# Patient Record
Sex: Male | Born: 1950
Health system: Southern US, Community
[De-identification: ages and names within clinical notes are randomized; demographics above are authoritative.]

## PROBLEM LIST (undated history)

## (undated) DIAGNOSIS — L719 Rosacea, unspecified: Secondary | ICD-10-CM

## (undated) DIAGNOSIS — T7840XA Allergy, unspecified, initial encounter: Secondary | ICD-10-CM

## (undated) DIAGNOSIS — K5790 Diverticulosis of intestine, part unspecified, without perforation or abscess without bleeding: Secondary | ICD-10-CM

## (undated) DIAGNOSIS — I10 Essential (primary) hypertension: Secondary | ICD-10-CM

## (undated) DIAGNOSIS — E785 Hyperlipidemia, unspecified: Secondary | ICD-10-CM

## (undated) DIAGNOSIS — Z8719 Personal history of other diseases of the digestive system: Secondary | ICD-10-CM

## (undated) HISTORY — DX: Allergy, unspecified, initial encounter: T78.40XA

## (undated) HISTORY — DX: Hyperlipidemia, unspecified: E78.5

## (undated) HISTORY — PX: TONSILLECTOMY: SUR1361

## (undated) HISTORY — DX: Diverticulosis of intestine, part unspecified, without perforation or abscess without bleeding: K57.90

## (undated) HISTORY — DX: Essential (primary) hypertension: I10

## (undated) HISTORY — DX: Rosacea, unspecified: L71.9

---

## 2007-12-19 ENCOUNTER — Encounter: Admission: RE | Admit: 2007-12-19 | Discharge: 2007-12-19 | Payer: Self-pay | Admitting: Internal Medicine

## 2008-08-20 HISTORY — PX: COLONOSCOPY: SHX174

## 2008-10-31 ENCOUNTER — Ambulatory Visit (HOSPITAL_COMMUNITY): Admission: RE | Admit: 2008-10-31 | Discharge: 2008-10-31 | Payer: Self-pay | Admitting: Family Medicine

## 2010-04-14 ENCOUNTER — Ambulatory Visit: Payer: Self-pay | Admitting: Internal Medicine

## 2010-04-14 LAB — CONVERTED CEMR LAB
AST: 27 units/L (ref 0–37)
Alkaline Phosphatase: 89 units/L (ref 39–117)
BUN: 11 mg/dL (ref 6–23)
Basophils Relative: 0.7 % (ref 0.0–3.0)
Bilirubin Urine: NEGATIVE
Bilirubin, Direct: 0.2 mg/dL (ref 0.0–0.3)
CO2: 29 meq/L (ref 19–32)
Chloride: 104 meq/L (ref 96–112)
Direct LDL: 137.1 mg/dL
Eosinophils Absolute: 0.2 10*3/uL (ref 0.0–0.7)
Eosinophils Relative: 2.4 % (ref 0.0–5.0)
Glucose, Bld: 77 mg/dL (ref 70–99)
HDL: 59.9 mg/dL (ref >39.00)
Hemoglobin: 17.7 g/dL — ABNORMAL HIGH (ref 13.0–17.0)
Lymphocytes Relative: 22.5 % (ref 12.0–46.0)
MCHC: 34.1 g/dL (ref 30.0–36.0)
Monocytes Relative: 6.5 % (ref 3.0–12.0)
Neutrophils Relative %: 67.9 % (ref 43.0–77.0)
Nitrite: NEGATIVE
Potassium: 5.4 meq/L — ABNORMAL HIGH (ref 3.5–5.1)
RBC: 5.34 M/uL (ref 4.22–5.81)
Sodium: 141 meq/L (ref 135–145)
Total CHOL/HDL Ratio: 4
Total Protein, Urine: NEGATIVE mg/dL
Total Protein: 7.3 g/dL (ref 6.0–8.3)
Urine Glucose: NEGATIVE mg/dL
VLDL: 22.6 mg/dL (ref 0.0–40.0)
WBC: 7.8 10*3/uL (ref 4.5–10.5)
pH: 6 (ref 5.0–8.0)

## 2010-04-18 ENCOUNTER — Encounter: Payer: Self-pay | Admitting: Internal Medicine

## 2010-04-18 ENCOUNTER — Ambulatory Visit: Payer: Self-pay | Admitting: Internal Medicine

## 2010-04-18 DIAGNOSIS — K5730 Diverticulosis of large intestine without perforation or abscess without bleeding: Secondary | ICD-10-CM | POA: Insufficient documentation

## 2010-04-18 DIAGNOSIS — R0609 Other forms of dyspnea: Secondary | ICD-10-CM

## 2010-04-18 DIAGNOSIS — R0989 Other specified symptoms and signs involving the circulatory and respiratory systems: Secondary | ICD-10-CM | POA: Insufficient documentation

## 2010-04-18 DIAGNOSIS — I10 Essential (primary) hypertension: Secondary | ICD-10-CM

## 2010-04-18 HISTORY — PX: COLONOSCOPY: SHX174

## 2010-07-03 ENCOUNTER — Telehealth: Payer: Self-pay | Admitting: Internal Medicine

## 2010-07-14 ENCOUNTER — Ambulatory Visit: Payer: Self-pay | Admitting: Internal Medicine

## 2010-07-14 LAB — CONVERTED CEMR LAB
BUN: 13 mg/dL (ref 6–23)
Basophils Relative: 0.5 % (ref 0.0–3.0)
Chloride: 106 meq/L (ref 96–112)
Eosinophils Relative: 4.9 % (ref 0.0–5.0)
HCT: 48.4 % (ref 39.0–52.0)
Lymphs Abs: 2.1 10*3/uL (ref 0.7–4.0)
MCV: 96.8 fL (ref 78.0–100.0)
Monocytes Absolute: 0.4 10*3/uL (ref 0.1–1.0)
Neutro Abs: 6.4 10*3/uL (ref 1.4–7.7)
Potassium: 4.6 meq/L (ref 3.5–5.1)
RBC: 5 M/uL (ref 4.22–5.81)
WBC: 9.4 10*3/uL (ref 4.5–10.5)

## 2010-07-25 ENCOUNTER — Ambulatory Visit: Payer: Self-pay | Admitting: Internal Medicine

## 2010-07-25 DIAGNOSIS — R21 Rash and other nonspecific skin eruption: Secondary | ICD-10-CM

## 2010-07-25 DIAGNOSIS — H9319 Tinnitus, unspecified ear: Secondary | ICD-10-CM | POA: Insufficient documentation

## 2010-09-19 NOTE — Assessment & Plan Note (Signed)
Summary: NEW CPX/UNITED HC/CD--OK'D DR PLOT   Vital Signs:  Patient profile:   60 year old male Height:      67 inches Weight:      215 pounds BMI:     33.80 O2 Sat:      95 % on Room air Temp:     97.2 degrees F oral Pulse rate:   87 / minute Pulse rhythm:   regular Resp:     16 per minute BP sitting:   128 / 80  (left arm) Cuff size:   regular  Vitals Entered By: Lanier Prude, CMA(AAMA) (April 18, 2010 3:02 PM)  O2 Flow:  Room air Is Patient Diabetic? No   History of Present Illness: The patient presents for a preventive health examination - new  Preventive Screening-Counseling & Management  Alcohol-Tobacco     Smoking Status: quit  Caffeine-Diet-Exercise     Does Patient Exercise: no  Current Medications (verified): 1)  Diovan 320 Mg Tabs (Valsartan) .Marland Kitchen.. 1 Once Daily 2)  Valacyclovir Hcl 1 Gm Tabs (Valacyclovir Hcl) .... As Needed 3)  Amlodipine Besylate 10 Mg Tabs (Amlodipine Besylate) .Marland Kitchen.. 1 Once Daily 4)  Claritin 10 Mg Tabs (Loratadine) .Marland Kitchen.. 1 Once Daily 5)  Zolpidem Tartrate 10 Mg Tabs (Zolpidem Tartrate) .Marland Kitchen.. 1 At Bedtime 6)  Aspirin 81 Mg Chew (Aspirin) .Marland Kitchen.. 1 Once Daily  Allergies (verified): No Known Drug Allergies  Past History:  Past Medical History: Hypertension Rosacea Gen herpes on the leg Diverticulosis/itis, colonoscopy 2009 Dr Tedra [place] Tinnitus  Past Surgical History: Tonsillectomy  Family History: F died of diverticulitis DM M DM Family History Diabetes 1st degree relative  Social History: Occupation: Herbalist Married Former Smoker Alcohol use-yes Regular exercise-no Smoking Status:  quit Does Patient Exercise:  no  Review of Systems       The patient complains of weight gain.  The patient denies anorexia, fever, weight loss, vision loss, decreased hearing, hoarseness, chest pain, syncope, dyspnea on exertion, peripheral edema, prolonged cough, headaches, hemoptysis, abdominal pain, melena, hematochezia,  severe indigestion/heartburn, hematuria, incontinence, genital sores, muscle weakness, suspicious skin lesions, transient blindness, difficulty walking, depression, unusual weight change, abnormal bleeding, enlarged lymph nodes, angioedema, and testicular masses.         snoring  Physical Exam  General:   well-developed and overweight-appearing.   Head:  Normocephalic and atraumatic without obvious abnormalities. No apparent alopecia or balding. Eyes:  No corneal or conjunctival inflammation noted. EOMI. Perrla. Funduscopic exam benign, without hemorrhages, exudates or papilledema. Vision grossly normal. Ears:  External ear exam shows no significant lesions or deformities.  Otoscopic examination reveals clear canals, tympanic membranes are intact bilaterally without bulging, retraction, inflammation or discharge. Hearing is grossly normal bilaterally. Nose:  External nasal examination shows no deformity or inflammation. Nasal mucosa are pink and moist without lesions or exudates. Mouth:  Oral mucosa and oropharynx without lesions or exudates.  Teeth in good repair. Neck:  No deformities, masses, or tenderness noted. Lungs:  Normal respiratory effort, chest expands symmetrically. Lungs are clear to auscultation, no crackles or wheezes. Heart:  Normal rate and regular rhythm. S1 and S2 normal without gallop, murmur, click, rub or other extra sounds. Abdomen:  Bowel sounds positive,abdomen soft and non-tender without masses, organomegaly or hernias noted. Rectal:  No external abnormalities noted. Normal sphincter tone. No rectal masses or tenderness. Genitalia:  Testes bilaterally descended without nodularity, tenderness or masses. No scrotal masses or lesions. No penis lesions or urethral discharge. Prostate:  Prostate  gland firm and smooth, no enlargement, nodularity, tenderness, mass, asymmetry or induration. Msk:  No deformity or scoliosis noted of thoracic or lumbar spine.   Pulses:  R and L  carotid,radial,femoral,dorsalis pedis and posterior tibial pulses are full and equal bilaterally Extremities:  No clubbing, cyanosis, edema, or deformity noted with normal full range of motion of all joints.   Neurologic:  No cranial nerve deficits noted. Station and gait are normal. Plantar reflexes are down-going bilaterally. DTRs are symmetrical throughout. Sensory, motor and coordinative functions appear intact. Skin:  Intact without suspicious lesions or rashes Cervical Nodes:  No lymphadenopathy noted Inguinal Nodes:  No significant adenopathy Psych:  Cognition and judgment appear intact. Alert and cooperative with normal attention span and concentration. No apparent delusions, illusions, hallucinations   Impression & Recommendations:  Problem # 1:  HEALTH MAINTENANCE EXAM (ICD-V70.0) Assessment New  Health and age related issues were discussed. Available screening tests and vaccinations were discussed as well. Healthy life style including good diet and exercise was discussed. The labs were reviewed with the patient.   Orders: EKG w/ Interpretation (93000) OK  Problem # 2:  SNORING (ICD-786.09) Assessment: Comment Only Denies OSA  Problem # 3:  DIVERTICULOSIS, COLON (ICD-562.10) Assessment: Unchanged  Problem # 4:  HYPERTENSION (ICD-401.9) Assessment: Unchanged  His updated medication list for this problem includes:    Diovan 320 Mg Tabs (Valsartan) .Marland Kitchen... 1 once daily    Amlodipine Besylate 10 Mg Tabs (Amlodipine besylate) .Marland Kitchen... 1 once daily  BP today: 128/80  Labs Reviewed: K+: 5.4 (04/14/2010) Creat: : 1.1 (04/14/2010)   Chol: 222 (04/14/2010)   HDL: 59.90 (04/14/2010)   TG: 113.0 (04/14/2010)  Complete Medication List: 1)  Diovan 320 Mg Tabs (Valsartan) .Marland Kitchen.. 1 once daily 2)  Valacyclovir Hcl 1 Gm Tabs (Valacyclovir hcl) .... As needed 3)  Amlodipine Besylate 10 Mg Tabs (Amlodipine besylate) .Marland Kitchen.. 1 once daily 4)  Claritin 10 Mg Tabs (Loratadine) .Marland Kitchen.. 1 once  daily 5)  Zolpidem Tartrate 10 Mg Tabs (Zolpidem tartrate) .Marland Kitchen.. 1 at bedtime 6)  Aspirin 81 Mg Chew (Aspirin) .Marland Kitchen.. 1 once daily 7)  Vitamin D 1000 Unit Tabs (Cholecalciferol) .Marland Kitchen.. 1 by mouth qd 8)  Flonase 50 Mcg/act Susp (Fluticasone propionate) .Marland Kitchen.. 1 spr each nostr qd as needed  Other Orders: Future Orders: Admin 1st Vaccine (16109) ... 04/19/2010 Flu Vaccine 46yrs + (60454) ... 04/19/2010  Patient Instructions: 1)  Please schedule a follow-up appointment in 3 months. 2)  BMP prior to visit, ICD-9: 3)  CBC w/ Diff prior to visit, ICD-9: 995.20 4)  Contour pillow 5)  Try to eat more raw plant food, fresh and dry fruit, raw almonds, leafy vegetables, whole foods and less red meat, less animal fat. Poultry and fish is better for you than pork and beef. Avoid processed foods (canned soups, hot dogs, sausage, bacon , frozen dinners). Avoid corn syrup, high fructose syrup or aspartam  containing drinks. Honey, Agave and Stevia are better sweeteners. 6)  Use the Sinus rinse as needed  Prescriptions: AMLODIPINE BESYLATE 10 MG TABS (AMLODIPINE BESYLATE) 1 once daily  #30 x 11   Entered and Authorized by:   Tresa Garter MD   Signed by:   Tresa Garter MD on 04/18/2010   Method used:   Print then Give to Patient   RxID:   [id number] VALACYCLOVIR HCL 1 GM TABS (VALACYCLOVIR HCL) as needed  #30 x 11   Entered and Authorized by:   Tresa Garter MD  Signed by:   Tresa Garter MD on 04/18/2010   Method used:   Print then Give to Patient   RxID:   [id number] DIOVAN 320 MG TABS (VALSARTAN) 1 once daily  #30 x 11   Entered and Authorized by:   Tresa Garter MD   Signed by:   Tresa Garter MD on 04/18/2010   Method used:   Print then Give to Patient   RxID:   [id number] ZOLPIDEM TARTRATE 10 MG TABS (ZOLPIDEM TARTRATE) 1 at bedtime  #30 x 6   Entered and Authorized by:   Tresa Garter MD   Signed by:   Tresa Garter MD on  04/18/2010   Method used:   Print then Give to Patient   RxID:   [id number] FLONASE 50 MCG/ACT SUSP (FLUTICASONE PROPIONATE) 1 spr each nostr qd as needed  #1 x 6   Entered and Authorized by:   Tresa Garter MD   Signed by:   Tresa Garter MD on 04/18/2010   Method used:   Print then Give to Patient   RxID:   [id number]  .lbflu   Flu Vaccine Consent Questions     Do you have a history of severe allergic reactions to this vaccine? no    Any prior history of allergic reactions to egg and/or gelatin? no    Do you have a sensitivity to the preservative Thimersol? no    Do you have a past history of Guillan-Barre Syndrome? no    Do you currently have an acute febrile illness? no    Have you ever had a severe reaction to latex? no    Vaccine information given and explained to patient? yes    Are you currently pregnant? no    Lot Number:[id number]   Exp Date:02/17/2011   Site Given  Left Deltoid IM Lanier Prude, [facility])  April 19, 2010 10:55 AM

## 2010-09-19 NOTE — Assessment & Plan Note (Signed)
Summary: 3 MO ROV /NWS  #   Vital Signs:  Patient profile:   60 year old male Height:      67 inches Weight:      215 pounds BMI:     33.80 O2 Sat:      96 % on Room air Temp:     98.1 degrees F oral Pulse rate:   83 / minute Pulse rhythm:   regular BP sitting:   128 / 80  (left arm) Cuff size:   large  Vitals Entered By: Rock Nephew CMA (July 25, 2010 7:56 AM)  O2 Flow:  Room air CC: follow-up visit Is Patient Diabetic? No Pain Assessment Patient in pain? no       Does patient need assistance? Functional Status Self care Ambulation Normal   CC:  follow-up visit.  History of Present Illness: F/u HTN, tinnitus, insomnia  Current Medications (verified): 1)  Diovan 320 Mg Tabs (Valsartan) .Marland Kitchen.. 1 Once Daily 2)  Valacyclovir Hcl 1 Gm Tabs (Valacyclovir Hcl) .... As Needed 3)  Amlodipine Besylate 10 Mg Tabs (Amlodipine Besylate) .Marland Kitchen.. 1 Once Daily 4)  Claritin 10 Mg Tabs (Loratadine) .Marland Kitchen.. 1 Once Daily 5)  Zolpidem Tartrate 10 Mg Tabs (Zolpidem Tartrate) .Marland Kitchen.. 1 At Bedtime 6)  Aspirin 81 Mg Chew (Aspirin) .Marland Kitchen.. 1 Once Daily 7)  Vitamin D 1000 Unit Tabs (Cholecalciferol) .Marland Kitchen.. 1 By Mouth Qd 8)  Flonase 50 Mcg/act Susp (Fluticasone Propionate) .Marland Kitchen.. 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   Eyes:  No corneal or conjunctival inflammation noted. EOMI. Perrla. Funduscopic exam benign, without hemorrhages, exudates or papilledema. Vision grossly normal. Ears:  slight skin irritation in the canal opening B Mouth:  Oral mucosa and oropharynx without lesions or  exudates.  Teeth in good repair. Neck:  No deformities, masses, or tenderness noted. Lungs:  Normal respiratory effort, chest expands symmetrically. Lungs are clear to auscultation, no crackles or wheezes. Heart:  Normal rate and regular rhythm. S1 and S2 normal without gallop, murmur, click, rub or other extra sounds. Abdomen:  Bowel sounds positive,abdomen soft and non-tender without masses, organomegaly or hernias noted. Msk:  No deformity or scoliosis noted of thoracic or lumbar spine.   Neurologic:  No cranial nerve deficits noted. Station and gait are normal. Plantar reflexes are down-going bilaterally. DTRs are symmetrical throughout. Sensory, motor and coordinative functions appear intact. Skin:  Intact without suspicious lesions or rashes Psych:  Cognition and judgment appear intact. Alert and cooperative with normal attention span and concentration. No apparent delusions, illusions, hallucinations   Impression & Recommendations:  Problem # 1:  TINNITUS (ICD-388.30) Assessment Unchanged Try gabapentin  Problem # 2:  HYPERTENSION (ICD-401.9) Assessment: Improved  His updated medication list for this problem includes:    Diovan 320 Mg Tabs (Valsartan) .Marland Kitchen... 1 once daily    Amlodipine Besylate 10 Mg Tabs (Amlodipine besylate) .Marland Kitchen... 1 once daily  BP today: 128/80 Prior BP: 128/80 (04/18/2010)  Labs Reviewed: K+: 4.6 (07/14/2010) Creat: : 1.2 (07/14/2010)   Chol: 222 (04/14/2010)   HDL: 59.90 (04/14/2010)   TG: 113.0 (04/14/2010)  Problem # 3:  SKIN RASH (ICD-782.1) ears Assessment: New  His  updated medication list for this problem includes:    Lotrisone 1-0.05 % Crea (Clotrimazole-betamethasone) ..... Use bid  Problem # 4:  SNORING (ICD-786.09) Assessment: Unchanged  Complete Medication List: 1)  Diovan 320 Mg Tabs (Valsartan) .Marland Kitchen.. 1 once daily 2)  Valacyclovir Hcl 1 Gm Tabs (Valacyclovir hcl) .... As needed 3)  Amlodipine Besylate 10 Mg Tabs (Amlodipine besylate) .Marland Kitchen..  1 once daily 4)  Claritin 10 Mg Tabs (Loratadine) .Marland Kitchen.. 1 once daily 5)  Zolpidem Tartrate 10 Mg Tabs (Zolpidem tartrate) .Marland Kitchen.. 1 at bedtime 6)  Aspirin 81 Mg Chew (Aspirin) .Marland Kitchen.. 1 once daily 7)  Vitamin D 1000 Unit Tabs (Cholecalciferol) .Marland Kitchen.. 1 by mouth qd 8)  Flonase 50 Mcg/act Susp (Fluticasone propionate) .Marland Kitchen.. 1 spr each nostr qd as needed 9)  Gabapentin 100 Mg Caps (Gabapentin) .Marland Kitchen.. 1-2 by mouth at bedtime or bid as needed tinnitus 10)  Lotrisone 1-0.05 % Crea (Clotrimazole-betamethasone) .... Use bid  Patient Instructions: 1)  Use the Sinus rinse as needed  2)  Please schedule a follow-up appointment in 1 year well w/labs. Prescriptions: LOTRISONE 1-0.05 % CREA (CLOTRIMAZOLE-BETAMETHASONE) use bid  #45g x 1   Entered and Authorized by:   Tresa Garter MD   Signed by:   Tresa Garter MD on 07/25/2010   Method used:   Print then Give to Patient   RxID:   [id number] GABAPENTIN 100 MG CAPS (GABAPENTIN) 1-2 by mouth at bedtime or bid as needed tinnitus  #120 x 6   Entered and Authorized by:   Tresa Garter MD   Signed by:   Tresa Garter MD on 07/25/2010   Method used:   Print then Give to Patient   RxID:   [id number]    Orders Added: 1)  Est. Patient Level IV [84696]

## 2010-09-19 NOTE — Progress Notes (Signed)
Summary: DOT PHYSICAL  Phone Note Call from Patient Call back at [phone]   Summary of Call: Pt needs DOT physical. Can pt drop off DOT form for completion? Or does he need another office visit?  Initial call taken by: Lamar Sprinkles, CMA,  July 03, 2010 10:35 AM  Follow-up for Phone Call        needs OV - bring paperwork w/you Follow-up by: Tresa Garter MD,  July 03, 2010 9:03 PM  Additional Follow-up for Phone Call Additional follow up Details #1::        pt informed  Additional Follow-up by: Lanier Prude, [facility]),  July 04, 2010 8:29 AM

## 2010-10-02 ENCOUNTER — Telehealth: Payer: Self-pay | Admitting: Internal Medicine

## 2010-10-11 NOTE — Progress Notes (Signed)
Summary: RF AMBIEN   Phone Note Refill Request Message from:  Pharmacy  Refills Requested: Medication #1:  ZOLPIDEM TARTRATE 10 MG TABS 1 at bedtime [facility]  Initial call taken by: Lamar Sprinkles, CMA,  October 02, 2010 6:40 PM  Follow-up for Phone Call        ok to ref x 3 Follow-up by: Tresa Garter MD,  October 02, 2010 10:14 PM    Prescriptions: ZOLPIDEM TARTRATE 10 MG TABS (ZOLPIDEM TARTRATE) 1 at bedtime  #30 x 3   Entered by:   Rock Nephew CMA   Authorized by:   Tresa Garter MD   Signed by:   Rock Nephew CMA on 10/03/2010   Method used:   Telephoned to ...       [facility]* (retail)       [address]       [place], [place]  [id number]       Ph: [phone]       Fax: [phone]   RxID:   [id number]

## 2010-11-30 LAB — CREATININE, SERUM: GFR calc non Af Amer: >60 mL/min (ref >60)

## 2011-01-25 ENCOUNTER — Other Ambulatory Visit: Payer: Self-pay | Admitting: Internal Medicine

## 2011-01-25 NOTE — Telephone Encounter (Signed)
Ok to Rf? 

## 2011-03-09 ENCOUNTER — Encounter: Payer: Self-pay | Admitting: Internal Medicine

## 2011-03-09 ENCOUNTER — Ambulatory Visit (INDEPENDENT_AMBULATORY_CARE_PROVIDER_SITE_OTHER): Payer: 59 | Admitting: Internal Medicine

## 2011-03-09 DIAGNOSIS — Z136 Encounter for screening for cardiovascular disorders: Secondary | ICD-10-CM

## 2011-03-09 DIAGNOSIS — R079 Chest pain, unspecified: Secondary | ICD-10-CM | POA: Insufficient documentation

## 2011-03-09 DIAGNOSIS — S2239XA Fracture of one rib, unspecified side, initial encounter for closed fracture: Secondary | ICD-10-CM

## 2011-03-09 DIAGNOSIS — S2232XA Fracture of one rib, left side, initial encounter for closed fracture: Secondary | ICD-10-CM | POA: Insufficient documentation

## 2011-03-09 MED ORDER — TRAMADOL HCL 50 MG PO TABS: 50.0000 mg | ORAL_TABLET | Freq: Two times a day (BID) | ORAL | Status: AC | PRN

## 2011-03-09 MED ORDER — MELOXICAM 15 MG PO TABS: 15.0000 mg | ORAL_TABLET | Freq: Every day | ORAL | Status: AC | PRN

## 2011-03-09 NOTE — Progress Notes (Signed)
  Subjective:    Patient ID: Raymond Snow, male    DOB: Oct 16, 1950, 60 y.o.   MRN: [id number]  HPI C/o sharp pain in L chest after he worked on his boat last Sat and was laying on the ledge cutting into his L ribs. He moved and had a sudden onset severe pain. Pain is worse w/ROM 8/10 C/o pain irrad in L back as well at times   Review of Systems  Constitutional: Negative for appetite change, fatigue and unexpected weight change.  HENT: Negative for nosebleeds, congestion, sore throat, sneezing, trouble swallowing and neck pain.   Eyes: Negative for itching and visual disturbance.  Respiratory: Negative for cough.   Cardiovascular: Positive for chest pain. Negative for palpitations and leg swelling.  Gastrointestinal: Negative for nausea, diarrhea, blood in stool and abdominal distention.  Genitourinary: Negative for frequency and hematuria.  Musculoskeletal: Negative for back pain, joint swelling and gait problem.  Skin: Negative for rash.  Neurological: Negative for dizziness, tremors, speech difficulty and weakness.  Psychiatric/Behavioral: Negative for sleep disturbance, dysphoric mood and agitation. The patient is not nervous/anxious.        Objective:   Physical Exam  Constitutional: He is oriented to person, place, and time. He appears well-developed.  HENT:  Mouth/Throat: Oropharynx is clear and moist.  Eyes: Conjunctivae are normal. Pupils are equal, round, and reactive to light.  Neck: Normal range of motion. No JVD present. No thyromegaly present.  Cardiovascular: Normal rate, regular rhythm, normal heart sounds and intact distal pulses.  Exam reveals no gallop and no friction rub.   No murmur heard. Pulmonary/Chest: Effort normal and breath sounds normal. No respiratory distress. He has no wheezes. He has no rales. He exhibits tenderness (tender under L breast).  Abdominal: Soft. Bowel sounds are normal. He exhibits no distension and no mass. There is no tenderness. There  is no rebound and no guarding.  Musculoskeletal: Normal range of motion. He exhibits no edema and no tenderness.  Lymphadenopathy:    He has no cervical adenopathy.  Neurological: He is alert and oriented to person, place, and time. He has normal reflexes. No cranial nerve deficit. He exhibits normal muscle tone. Coordination normal.  Skin: Skin is warm and dry. No rash noted.  Psychiatric: He has a normal mood and affect. His behavior is normal. Judgment and thought content normal.        Procedure Note :    Procedure :   Sonography examination chest wall   Indication: r/o rib fx   Equipment used: Sonosite M-Turbo with HFL38x/13-6 MHz transducer linear probe. The images were stored in the unit and later transferred in storage.  The patient was placed in a  R decubitus position.  This study revealed a L 6th rib cortex disruption in a  Bony part of the rib at mid-clav line and a small hematoma presence; the area is tender with probe pressure. Pleural sliding is normal.   Impression: L 6th rib fracture.     Assessment & Plan:

## 2011-03-09 NOTE — Assessment & Plan Note (Addendum)
Rib belt See Meds Ice

## 2011-03-09 NOTE — Patient Instructions (Signed)
Ice to chest

## 2011-03-09 NOTE — Assessment & Plan Note (Signed)
EKG was OK

## 2011-04-12 ENCOUNTER — Encounter: Payer: Self-pay | Admitting: Internal Medicine

## 2011-04-12 DIAGNOSIS — Z0000 Encounter for general adult medical examination without abnormal findings: Secondary | ICD-10-CM

## 2011-04-12 DIAGNOSIS — Z0389 Encounter for observation for other suspected diseases and conditions ruled out: Secondary | ICD-10-CM

## 2011-04-13 ENCOUNTER — Other Ambulatory Visit: Payer: Self-pay

## 2011-04-17 ENCOUNTER — Other Ambulatory Visit (INDEPENDENT_AMBULATORY_CARE_PROVIDER_SITE_OTHER): Payer: 59

## 2011-04-17 ENCOUNTER — Other Ambulatory Visit: Payer: Self-pay | Admitting: Internal Medicine

## 2011-04-17 DIAGNOSIS — Z0000 Encounter for general adult medical examination without abnormal findings: Secondary | ICD-10-CM

## 2011-04-17 DIAGNOSIS — Z0389 Encounter for observation for other suspected diseases and conditions ruled out: Secondary | ICD-10-CM

## 2011-04-17 LAB — TSH: TSH: 0.9 u[IU]/mL (ref 0.35–5.50)

## 2011-04-17 LAB — URINALYSIS, ROUTINE W REFLEX MICROSCOPIC
Bilirubin Urine: NEGATIVE
Hgb urine dipstick: NEGATIVE
Ketones, ur: NEGATIVE
Leukocytes, UA: NEGATIVE
Nitrite: NEGATIVE
Specific Gravity, Urine: >=1.03 (ref 1.000–1.030)
Total Protein, Urine: NEGATIVE
Urine Glucose: NEGATIVE
Urobilinogen, UA: 0.2 (ref 0.0–1.0)
pH: 5.5 (ref 5.0–8.0)

## 2011-04-17 LAB — CBC WITH DIFFERENTIAL/PLATELET
Basophils Absolute: 0.1 10*3/uL (ref 0.0–0.1)
Basophils Relative: 0.9 % (ref 0.0–3.0)
Eosinophils Absolute: 0.1 10*3/uL (ref 0.0–0.7)
Eosinophils Relative: 2 % (ref 0.0–5.0)
HCT: 48.2 % (ref 39.0–52.0)
Hemoglobin: 16 g/dL (ref 13.0–17.0)
Lymphocytes Relative: 24.5 % (ref 12.0–46.0)
Lymphs Abs: 1.5 10*3/uL (ref 0.7–4.0)
MCHC: 33.2 g/dL (ref 30.0–36.0)
MCV: 98.4 fl (ref 78.0–100.0)
Monocytes Absolute: 0.5 10*3/uL (ref 0.1–1.0)
Monocytes Relative: 7.4 % (ref 3.0–12.0)
Neutro Abs: 4 10*3/uL (ref 1.4–7.7)
Neutrophils Relative %: 65.2 % (ref 43.0–77.0)
Platelets: 230 10*3/uL (ref 150.0–400.0)
RBC: 4.9 Mil/uL (ref 4.22–5.81)
RDW: 15.3 % — ABNORMAL HIGH (ref 11.5–14.6)
WBC: 6.1 10*3/uL (ref 4.5–10.5)

## 2011-04-17 LAB — PSA: PSA: 0.5 ng/mL (ref 0.10–4.00)

## 2011-04-17 LAB — COMPREHENSIVE METABOLIC PANEL
ALT: 40 U/L (ref 0–53)
CO2: 26 mEq/L (ref 19–32)
GFR: 101.92 mL/min (ref >60.00)
Sodium: 142 mEq/L (ref 135–145)
Total Bilirubin: 0.8 mg/dL (ref 0.3–1.2)
Total Protein: 7 g/dL (ref 6.0–8.3)

## 2011-04-17 LAB — LIPID PANEL
Cholesterol: 211 mg/dL — ABNORMAL HIGH (ref 0–200)
HDL: 64.5 mg/dL (ref >39.00)
Total CHOL/HDL Ratio: 3
Triglycerides: 87 mg/dL (ref 0.0–149.0)
VLDL: 17.4 mg/dL (ref 0.0–40.0)

## 2011-04-17 LAB — LDL CHOLESTEROL, DIRECT: Direct LDL: 133.8 mg/dL

## 2011-04-20 ENCOUNTER — Encounter: Payer: Self-pay | Admitting: Internal Medicine

## 2011-04-26 ENCOUNTER — Ambulatory Visit (INDEPENDENT_AMBULATORY_CARE_PROVIDER_SITE_OTHER): Payer: 59 | Admitting: Internal Medicine

## 2011-04-26 ENCOUNTER — Encounter: Payer: Self-pay | Admitting: Internal Medicine

## 2011-04-26 DIAGNOSIS — S2232XA Fracture of one rib, left side, initial encounter for closed fracture: Secondary | ICD-10-CM

## 2011-04-26 DIAGNOSIS — M545 Low back pain, unspecified: Secondary | ICD-10-CM | POA: Insufficient documentation

## 2011-04-26 DIAGNOSIS — Z0000 Encounter for general adult medical examination without abnormal findings: Secondary | ICD-10-CM

## 2011-04-26 MED ORDER — AMOXICILLIN 500 MG PO CAPS: 1000.0000 mg | ORAL_CAPSULE | Freq: Two times a day (BID) | ORAL | Status: AC

## 2011-04-26 NOTE — Progress Notes (Signed)
Subjective:    Patient ID: Raymond Snow, male    DOB: 08-10-1951, 59 y.o.   MRN: [id number]  HPI  The patient is here for a wellness exam. The patient has been doing well overall without major physical or psychological issues going on lately.He appears well, in no apparent distress.  Alert and oriented times three, pleasant and cooperative. Vital signs are as documented in vital signs section. C/o L LBP x 1 wk - woke up w/it. Rib pain is gone.    Review of Systems  Constitutional: Negative for appetite change, fatigue and unexpected weight change.  HENT: Negative for nosebleeds, congestion, sore throat, sneezing, trouble swallowing and neck pain.   Eyes: Negative for itching and visual disturbance.  Respiratory: Negative for cough.   Cardiovascular: Negative for chest pain, palpitations and leg swelling.  Gastrointestinal: Negative for nausea, diarrhea, blood in stool and abdominal distention.  Genitourinary: Negative for frequency and hematuria.  Musculoskeletal: Negative for back pain, joint swelling and gait problem.  Skin: Negative for rash.  Neurological: Negative for dizziness, tremors, speech difficulty and weakness.  Psychiatric/Behavioral: Negative for sleep disturbance, dysphoric mood and agitation. The patient is not nervous/anxious.        Objective:   Physical Exam  Constitutional: He is oriented to person, place, and time. He appears well-developed and well-nourished. No distress.  HENT:  Head: Normocephalic and atraumatic.  Right Ear: External ear normal.  Left Ear: External ear normal.  Nose: Nose normal.  Mouth/Throat: Oropharynx is clear and moist. No oropharyngeal exudate.  Eyes: Conjunctivae and EOM are normal. Pupils are equal, round, and reactive to light. Right eye exhibits no discharge. Left eye exhibits no discharge. No scleral icterus.  Neck: Normal range of motion. Neck supple. No JVD present. No tracheal deviation present. No thyromegaly present.    Cardiovascular: Normal rate, regular rhythm, normal heart sounds and intact distal pulses.  Exam reveals no gallop and no friction rub.   No murmur heard. Pulmonary/Chest: Effort normal and breath sounds normal. No stridor. No respiratory distress. He has no wheezes. He has no rales. He exhibits no tenderness.  Abdominal: Soft. Bowel sounds are normal. He exhibits no distension and no mass. There is no tenderness. There is no rebound and no guarding.  Genitourinary: Rectum normal, prostate normal and penis normal. Guaiac negative stool. No penile tenderness.  Musculoskeletal: Normal range of motion. He exhibits no edema and no tenderness.  Lymphadenopathy:    He has no cervical adenopathy.  Neurological: He is alert and oriented to person, place, and time. He has normal reflexes. No cranial nerve deficit. He exhibits normal muscle tone. Coordination normal.  Skin: Skin is warm and dry. No rash noted. He is not diaphoretic. No erythema. No pallor.  Psychiatric: He has a normal mood and affect. His behavior is normal. Judgment and thought content normal.     Lab Results  Component Value Date   WBC 6.1 04/17/2011   HGB 16.0 04/17/2011   HCT 48.2 04/17/2011   PLT 230.0 04/17/2011   CHOL 211* 04/17/2011   TRIG 87.0 04/17/2011   HDL 64.50 04/17/2011   LDLDIRECT 133.8 04/17/2011   ALT 40 04/17/2011   AST 34 04/17/2011   NA 142 04/17/2011   K 4.5 04/17/2011   CL 107 04/17/2011   CREATININE 0.8 04/17/2011   BUN 15 04/17/2011   CO2 26 04/17/2011   TSH 0.90 04/17/2011   PSA 0.50 04/17/2011        Assessment & Plan:

## 2011-04-26 NOTE — Assessment & Plan Note (Signed)
healed 

## 2011-04-26 NOTE — Patient Instructions (Signed)
Go no [email or web address] and look up "glut stretches"

## 2011-04-29 NOTE — Assessment & Plan Note (Signed)
We discussed age appropriate health related issues, including available/recomended screening tests and vaccinations. We discussed a need for adhering to healthy diet and exercise. Labs/EKG were reviewed/ordered. All questions were answered.  DOT filled out 

## 2011-05-04 ENCOUNTER — Other Ambulatory Visit: Payer: Self-pay | Admitting: Internal Medicine

## 2011-07-18 ENCOUNTER — Other Ambulatory Visit: Payer: Self-pay | Admitting: Internal Medicine

## 2011-07-19 ENCOUNTER — Telehealth: Payer: Self-pay | Admitting: *Deleted

## 2011-07-19 MED ORDER — ZOLPIDEM TARTRATE 10 MG PO TABS: 10.0000 mg | ORAL_TABLET | Freq: Every evening | ORAL | Status: DC | PRN

## 2011-07-19 NOTE — Telephone Encounter (Signed)
OK to fill this prescription with additional refills x3 Thank you!  

## 2011-07-19 NOTE — Telephone Encounter (Signed)
Requested Medications     AMBIEN 10 MG tablet [Pharmacy Med Name: AMBIEN 10 MG TABLET]   TAKE 1 TABLET AT BEDTIME AS NEEDED.   Disp: 90 each Start: 07/18/2011  Class: Normal   Originally ordered on: 10/22/2010  Last refill: 04/25/2011

## 2011-10-25 ENCOUNTER — Other Ambulatory Visit: Payer: Self-pay | Admitting: Internal Medicine

## 2011-11-12 ENCOUNTER — Other Ambulatory Visit: Payer: Self-pay | Admitting: Internal Medicine

## 2011-12-31 ENCOUNTER — Other Ambulatory Visit: Payer: Self-pay | Admitting: Internal Medicine

## 2012-03-26 ENCOUNTER — Other Ambulatory Visit: Payer: Self-pay | Admitting: Internal Medicine

## 2012-03-27 NOTE — Telephone Encounter (Signed)
Ok to Rf? 

## 2012-05-13 ENCOUNTER — Telehealth: Payer: Self-pay | Admitting: Internal Medicine

## 2012-05-13 ENCOUNTER — Other Ambulatory Visit: Payer: Self-pay | Admitting: Internal Medicine

## 2012-05-13 NOTE — Telephone Encounter (Signed)
Caller: [name]/Patient; Phone: [phone]; Reason for Call: Caller: [name]/Patient; Patient Name: Raymond Snow; PCP: Sonda Primes (Adults only); Best Callback Phone Number: [phone]  Patient states he is prescribed Valtrex 1000mg .  Tablets; 1/2 tablet twice daily for a diagnosis of genital herpes.  Patient denies having a current outbreak or current symptoms.  Patient states Pharmacy has requested a refill but refill request has not been authorized.  RN reviewed oreder in [employer] Record for Valtrex 1 gram; take one tablet as needed; last written on 12/31/11; dispense #30 with 3 refills.   PATIENT REQUESTING REFILL FOR VALTREX 1 GRAM; 1/2 TABLET BID TO BE CALLED INTO [facility], [facility], [place] AT [phone].  PATIENT CAN BE REACHED AT [phone].

## 2012-05-14 NOTE — Telephone Encounter (Signed)
Rx denied because pt needs OV - advised of same via VM

## 2012-05-15 ENCOUNTER — Other Ambulatory Visit: Payer: Self-pay | Admitting: Internal Medicine

## 2012-06-11 ENCOUNTER — Other Ambulatory Visit (INDEPENDENT_AMBULATORY_CARE_PROVIDER_SITE_OTHER): Payer: 59

## 2012-06-11 ENCOUNTER — Other Ambulatory Visit: Payer: Self-pay | Admitting: *Deleted

## 2012-06-11 DIAGNOSIS — Z0389 Encounter for observation for other suspected diseases and conditions ruled out: Secondary | ICD-10-CM

## 2012-06-11 DIAGNOSIS — Z0000 Encounter for general adult medical examination without abnormal findings: Secondary | ICD-10-CM

## 2012-06-11 LAB — BASIC METABOLIC PANEL
CO2: 25 mEq/L (ref 19–32)
Calcium: 9.2 mg/dL (ref 8.4–10.5)
Creatinine, Ser: 1.1 mg/dL (ref 0.4–1.5)
GFR: 73.1 mL/min (ref >60.00)
Glucose, Bld: 82 mg/dL (ref 70–99)
Sodium: 138 mEq/L (ref 135–145)

## 2012-06-11 LAB — URINALYSIS, ROUTINE W REFLEX MICROSCOPIC
Hgb urine dipstick: NEGATIVE
Leukocytes, UA: NEGATIVE
Nitrite: NEGATIVE
Specific Gravity, Urine: 1.025 (ref 1.000–1.030)
Urine Glucose: NEGATIVE
Urobilinogen, UA: 0.2 (ref 0.0–1.0)

## 2012-06-11 LAB — LIPID PANEL
Cholesterol: 214 mg/dL — ABNORMAL HIGH (ref 0–200)
HDL: 59.6 mg/dL (ref >39.00)
Triglycerides: 109 mg/dL (ref 0.0–149.0)

## 2012-06-11 LAB — CBC WITH DIFFERENTIAL/PLATELET
Eosinophils Absolute: 0.2 10*3/uL (ref 0.0–0.7)
HCT: 46.1 % (ref 39.0–52.0)
Hemoglobin: 15.4 g/dL (ref 13.0–17.0)
Lymphs Abs: 1.9 10*3/uL (ref 0.7–4.0)
MCV: 95.2 fl (ref 78.0–100.0)
Monocytes Relative: 5.4 % (ref 3.0–12.0)
Neutro Abs: 5.8 10*3/uL (ref 1.4–7.7)
RDW: 14 % (ref 11.5–14.6)

## 2012-06-11 LAB — LDL CHOLESTEROL, DIRECT: Direct LDL: 136.9 mg/dL

## 2012-06-11 LAB — HEPATIC FUNCTION PANEL
Albumin: 3.6 g/dL (ref 3.5–5.2)
Alkaline Phosphatase: 90 U/L (ref 39–117)
Total Protein: 6.9 g/dL (ref 6.0–8.3)

## 2012-06-12 LAB — PSA: PSA: 1 ng/mL (ref 0.10–4.00)

## 2012-06-12 LAB — TSH: TSH: 1.38 u[IU]/mL (ref 0.35–5.50)

## 2012-06-18 ENCOUNTER — Ambulatory Visit (INDEPENDENT_AMBULATORY_CARE_PROVIDER_SITE_OTHER): Payer: 59 | Admitting: Internal Medicine

## 2012-06-18 ENCOUNTER — Encounter: Payer: Self-pay | Admitting: Internal Medicine

## 2012-06-18 VITALS — BP 114/62 | HR 72 | Temp 98.2°F | Resp 16 | Wt 215.0 lb

## 2012-06-18 DIAGNOSIS — Z23 Encounter for immunization: Secondary | ICD-10-CM

## 2012-06-18 DIAGNOSIS — Z0000 Encounter for general adult medical examination without abnormal findings: Secondary | ICD-10-CM

## 2012-06-18 DIAGNOSIS — Z2911 Encounter for prophylactic immunotherapy for respiratory syncytial virus (RSV): Secondary | ICD-10-CM

## 2012-06-18 MED ORDER — ZOLPIDEM TARTRATE 10 MG PO TABS: 10.0000 mg | ORAL_TABLET | Freq: Every evening | ORAL | Status: DC | PRN

## 2012-06-18 MED ORDER — VALSARTAN 320 MG PO TABS: 320.0000 mg | ORAL_TABLET | Freq: Every day | ORAL | Status: DC

## 2012-06-18 MED ORDER — VALACYCLOVIR HCL 1 G PO TABS: 500.0000 mg | ORAL_TABLET | Freq: Two times a day (BID) | ORAL | Status: DC

## 2012-06-18 MED ORDER — TRIAMCINOLONE ACETONIDE 0.5 % EX CREA: TOPICAL_CREAM | Freq: Three times a day (TID) | CUTANEOUS | Status: DC

## 2012-06-18 MED ORDER — AMLODIPINE BESYLATE 10 MG PO TABS: 10.0000 mg | ORAL_TABLET | Freq: Every day | ORAL | Status: DC

## 2012-06-18 MED ORDER — FLUTICASONE PROPIONATE 50 MCG/ACT NA SUSP: 1.0000 | Freq: Every day | NASAL | Status: DC | PRN

## 2012-06-18 NOTE — Assessment & Plan Note (Signed)
We discussed age appropriate health related issues, including available/recomended screening tests and vaccinations. We discussed a need for adhering to healthy diet and exercise. Labs/EKG were reviewed/ordered. All questions were answered.   

## 2012-06-18 NOTE — Progress Notes (Signed)
  Subjective:    Patient ID: Raymond Snow, male    DOB: [birth year], 61 y.o.   MRN: [id number]  HPI  The patient is here for a wellness exam. The patient has been doing well overall without major physical or psychological issues going on lately.He appears well, in no apparent distress.  Alert and oriented times three, pleasant and cooperative. Vital signs are as documented in vital signs section. C/o L big toe hurts - injured 2 wks ago  Review of Systems  Constitutional: Negative for appetite change, fatigue and unexpected weight change.  HENT: Negative for nosebleeds, congestion, sore throat, sneezing, trouble swallowing and neck pain.   Eyes: Negative for itching and visual disturbance.  Respiratory: Negative for cough.   Cardiovascular: Negative for chest pain, palpitations and leg swelling.  Gastrointestinal: Negative for nausea, diarrhea, blood in stool and abdominal distention.  Genitourinary: Negative for frequency and hematuria.  Musculoskeletal: Negative for back pain, joint swelling and gait problem.  Skin: Negative for rash.  Neurological: Negative for dizziness, tremors, speech difficulty and weakness.  Psychiatric/Behavioral: Negative for disturbed wake/sleep cycle, dysphoric mood and agitation. The patient is not nervous/anxious.        Objective:   Physical Exam  Constitutional: He is oriented to person, place, and time. He appears well-developed and well-nourished. No distress.  HENT:  Head: Normocephalic and atraumatic.  Right Ear: External ear normal.  Left Ear: External ear normal.  Nose: Nose normal.  Mouth/Throat: Oropharynx is clear and moist. No oropharyngeal exudate.  Eyes: Conjunctivae normal and EOM are normal. Pupils are equal, round, and reactive to light. Right eye exhibits no discharge. Left eye exhibits no discharge. No scleral icterus.  Neck: Normal range of motion. Neck supple. No JVD present. No tracheal deviation present. No thyromegaly present.    Cardiovascular: Normal rate, regular rhythm, normal heart sounds and intact distal pulses.  Exam reveals no gallop and no friction rub.   No murmur heard. Pulmonary/Chest: Effort normal and breath sounds normal. No stridor. No respiratory distress. He has no wheezes. He has no rales. He exhibits no tenderness.  Abdominal: Soft. Bowel sounds are normal. He exhibits no distension and no mass. There is no tenderness. There is no rebound and no guarding.  Genitourinary: Rectum normal, prostate normal and penis normal. Guaiac negative stool. No penile tenderness.  Musculoskeletal: Normal range of motion. He exhibits no edema and no tenderness.  Lymphadenopathy:    He has no cervical adenopathy.  Neurological: He is alert and oriented to person, place, and time. He has normal reflexes. No cranial nerve deficit. He exhibits normal muscle tone. Coordination normal.  Skin: Skin is warm and dry. No rash noted. He is not diaphoretic. No erythema. No pallor.  Psychiatric: He has a normal mood and affect. His behavior is normal. Judgment and thought content normal.  Toes - wnl   Lab Results  Component Value Date   WBC 8.4 06/11/2012   HGB 15.4 06/11/2012   HCT 46.1 06/11/2012   PLT 214.0 06/11/2012   CHOL 214* 06/11/2012   TRIG 109.0 06/11/2012   HDL 59.60 06/11/2012   LDLDIRECT 136.9 06/11/2012   ALT 21 06/11/2012   AST 21 06/11/2012   NA 138 06/11/2012   K 4.7 06/11/2012   CL 105 06/11/2012   CREATININE 1.1 06/11/2012   BUN 13 06/11/2012   CO2 25 06/11/2012   TSH 1.38 06/11/2012   PSA 1.00 06/11/2012        Assessment & Plan:

## 2012-10-20 ENCOUNTER — Other Ambulatory Visit: Payer: Self-pay | Admitting: Internal Medicine

## 2012-11-22 ENCOUNTER — Emergency Department (HOSPITAL_COMMUNITY): Payer: 59

## 2012-11-22 ENCOUNTER — Emergency Department (HOSPITAL_COMMUNITY)
Admission: EM | Admit: 2012-11-22 | Discharge: 2012-11-22 | Disposition: A | Discharge status: Home / Self Care | Payer: 59 | Attending: Emergency Medicine | Admitting: Emergency Medicine

## 2012-11-22 ENCOUNTER — Encounter (HOSPITAL_COMMUNITY): Payer: Self-pay | Admitting: Emergency Medicine

## 2012-11-22 DIAGNOSIS — S52599A Other fractures of lower end of unspecified radius, initial encounter for closed fracture: Secondary | ICD-10-CM | POA: Insufficient documentation

## 2012-11-22 DIAGNOSIS — Y929 Unspecified place or not applicable: Secondary | ICD-10-CM | POA: Insufficient documentation

## 2012-11-22 DIAGNOSIS — S0990XA Unspecified injury of head, initial encounter: Secondary | ICD-10-CM | POA: Insufficient documentation

## 2012-11-22 DIAGNOSIS — S298XXA Other specified injuries of thorax, initial encounter: Secondary | ICD-10-CM | POA: Insufficient documentation

## 2012-11-22 DIAGNOSIS — W11XXXA Fall on and from ladder, initial encounter: Secondary | ICD-10-CM | POA: Insufficient documentation

## 2012-11-22 DIAGNOSIS — Z79899 Other long term (current) drug therapy: Secondary | ICD-10-CM | POA: Insufficient documentation

## 2012-11-22 DIAGNOSIS — I10 Essential (primary) hypertension: Secondary | ICD-10-CM | POA: Insufficient documentation

## 2012-11-22 DIAGNOSIS — S8990XA Unspecified injury of unspecified lower leg, initial encounter: Secondary | ICD-10-CM | POA: Insufficient documentation

## 2012-11-22 DIAGNOSIS — A6000 Herpesviral infection of urogenital system, unspecified: Secondary | ICD-10-CM | POA: Insufficient documentation

## 2012-11-22 DIAGNOSIS — Y939 Activity, unspecified: Secondary | ICD-10-CM | POA: Insufficient documentation

## 2012-11-22 DIAGNOSIS — Z7982 Long term (current) use of aspirin: Secondary | ICD-10-CM | POA: Insufficient documentation

## 2012-11-22 DIAGNOSIS — Z87891 Personal history of nicotine dependence: Secondary | ICD-10-CM | POA: Insufficient documentation

## 2012-11-22 DIAGNOSIS — Z87898 Personal history of other specified conditions: Secondary | ICD-10-CM | POA: Insufficient documentation

## 2012-11-22 DIAGNOSIS — IMO0002 Reserved for concepts with insufficient information to code with codable children: Secondary | ICD-10-CM | POA: Insufficient documentation

## 2012-11-22 DIAGNOSIS — K5730 Diverticulosis of large intestine without perforation or abscess without bleeding: Secondary | ICD-10-CM | POA: Insufficient documentation

## 2012-11-22 DIAGNOSIS — S52502A Unspecified fracture of the lower end of left radius, initial encounter for closed fracture: Secondary | ICD-10-CM

## 2012-11-22 DIAGNOSIS — S99929A Unspecified injury of unspecified foot, initial encounter: Secondary | ICD-10-CM | POA: Insufficient documentation

## 2012-11-22 DIAGNOSIS — Z872 Personal history of diseases of the skin and subcutaneous tissue: Secondary | ICD-10-CM | POA: Insufficient documentation

## 2012-11-22 LAB — CBC WITH DIFFERENTIAL/PLATELET
Basophils Relative: 1 % (ref 0–1)
Eosinophils Relative: 1 % (ref 0–5)
HCT: 46.2 % (ref 39.0–52.0)
Hemoglobin: 15.9 g/dL (ref 13.0–17.0)
Lymphocytes Relative: 22 % (ref 12–46)
MCH: 32.2 pg (ref 26.0–34.0)
Monocytes Absolute: 0.9 10*3/uL (ref 0.1–1.0)
Neutro Abs: 8.5 10*3/uL — ABNORMAL HIGH (ref 1.7–7.7)
Neutrophils Relative %: 69 % (ref 43–77)
RBC: 4.94 MIL/uL (ref 4.22–5.81)

## 2012-11-22 LAB — COMPREHENSIVE METABOLIC PANEL
ALT: 19 U/L (ref 0–53)
Alkaline Phosphatase: 89 U/L (ref 39–117)
CO2: 24 mEq/L (ref 19–32)
GFR calc Af Amer: 51 mL/min — ABNORMAL LOW (ref >90)
GFR calc non Af Amer: 44 mL/min — ABNORMAL LOW (ref >90)
Glucose, Bld: 130 mg/dL — ABNORMAL HIGH (ref 70–99)
Potassium: 3.6 mEq/L (ref 3.5–5.1)
Sodium: 142 mEq/L (ref 135–145)
Total Bilirubin: 0.4 mg/dL (ref 0.3–1.2)

## 2012-11-22 MED ORDER — OXYCODONE HCL 5 MG PO TABS: 5.0000 mg | ORAL_TABLET | ORAL | Status: DC | PRN

## 2012-11-22 MED ORDER — HYDROMORPHONE HCL PF 1 MG/ML IJ SOLN
1.0000 mg | Freq: Once | INTRAMUSCULAR | Status: AC
  Administered 2012-11-22: 1 mg via INTRAVENOUS
  Filled 2012-11-22: qty 1

## 2012-11-22 MED ORDER — PROPOFOL 10 MG/ML IV BOLUS
0.5000 mg/kg | Freq: Once | INTRAVENOUS | Status: DC
  Filled 2012-11-22: qty 1

## 2012-11-22 MED ORDER — ONDANSETRON HCL 4 MG/2ML IJ SOLN
4.0000 mg | Freq: Once | INTRAMUSCULAR | Status: AC
  Administered 2012-11-22: 4 mg via INTRAVENOUS
  Filled 2012-11-22 (×2): qty 2

## 2012-11-22 MED ORDER — LIDOCAINE HCL 1 % IJ SOLN
INTRAMUSCULAR | Status: AC
  Filled 2012-11-22: qty 20

## 2012-11-22 MED ORDER — IOHEXOL 300 MG/ML  SOLN
100.0000 mL | Freq: Once | INTRAMUSCULAR | Status: AC | PRN
  Administered 2012-11-22: 100 mL via INTRAVENOUS

## 2012-11-22 MED ORDER — SODIUM CHLORIDE 0.9 % IV BOLUS (SEPSIS)
1000.0000 mL | Freq: Once | INTRAVENOUS | Status: AC
  Administered 2012-11-22: 1000 mL via INTRAVENOUS

## 2012-11-22 MED ORDER — FENTANYL CITRATE 0.05 MG/ML IJ SOLN: 50.0000 ug | Freq: Once | INTRAMUSCULAR | Status: DC

## 2012-11-22 NOTE — ED Provider Notes (Signed)
History     CSN: [id number]  Arrival date & time 11/22/12  1342   First MD Initiated Contact with Patient 11/22/12 1359      Chief Complaint  Patient presents with  . Fall  . Wrist Pain    (Consider location/radiation/quality/duration/timing/severity/associated sxs/prior treatment) HPI Comments: Patient presents with left wrist and left ankle pain after a fall from a ladder. States she was using an 18 foot extension ladder that slipped and he fell landing on his left side. Unsure if he hit his head but does think he saw stars. Was able to get himself up without assistance. States the only in that hurts is his wrist. Denies any head, neck, back, chest or abdominal pain. He reported to the nurse that he was feeling short of breath but denies this to me. Denies any anticoagulant use.  The history is provided by the patient.    Past Medical History  Diagnosis Date  . HTN (hypertension)   . Rosacea   . Genital herpes     on the leg  . Diverticulosis     colon 2009 Dr Tedra [place]  . Tinnitus     Past Surgical History  Procedure Laterality Date  . Tonsillectomy      Family History  Problem Relation Age of Onset  . Diverticulitis Father   . Diabetes Father   . Diabetes Mother     History  Substance Use Topics  . Smoking status: Former Smoker    Quit date: 08/20/1986  . Smokeless tobacco: Not on file  . Alcohol Use: Yes      Review of Systems  Constitutional: Negative for fever, activity change and appetite change.  HENT: Negative for congestion, rhinorrhea and neck pain.   Respiratory: Negative for chest tightness.   Cardiovascular: Negative for chest pain.  Gastrointestinal: Negative for nausea, vomiting and abdominal pain.  Genitourinary: Negative for dysuria and hematuria.  Musculoskeletal: Positive for myalgias, back pain and arthralgias.  Neurological: Negative for weakness and headaches.  A complete 10 system review of systems was obtained and all systems are  negative except as noted in the HPI and PMH.    Allergies  Review of patient's allergies indicates no known allergies.  Home Medications   Current Outpatient Rx  Name  Route  Sig  Dispense  Refill  . amLODipine (NORVASC) 10 MG tablet   Oral   Take 10 mg by mouth every morning.         Marland Kitchen aspirin 81 MG chewable tablet   Oral   Chew 81 mg by mouth daily.           . valACYclovir (VALTREX) 1000 MG tablet   Oral   Take 500 mg by mouth 2 (two) times daily.         . valsartan (DIOVAN) 320 MG tablet   Oral   Take 320 mg by mouth every morning.         . zolpidem (AMBIEN) 10 MG tablet   Oral   Take 10 mg by mouth at bedtime as needed for sleep.           BP 126/83  Pulse 76  Temp(Src) 97.7 F (36.5 C)  Resp 20  SpO2 97%  Physical Exam  Constitutional: He is oriented to person, place, and time. He appears well-developed and well-nourished. No distress.  HENT:  Head: Normocephalic and atraumatic.  Mouth/Throat: Oropharynx is clear and moist. No oropharyngeal exudate.  Eyes: Conjunctivae and EOM are normal. Pupils  are equal, round, and reactive to light.  Neck: Normal range of motion. Neck supple.  No C-spine pain, step-off or deformity  Cardiovascular: Normal rate, regular rhythm, normal heart sounds and intact distal pulses.   No murmur heard. Equal radial, DP, PT, femoral pulses.  Pulmonary/Chest: Effort normal and breath sounds normal. No respiratory distress.  Abdominal: Soft. There is no tenderness. There is no rebound and no guarding.  Musculoskeletal: Normal range of motion. He exhibits tenderness. He exhibits no edema.  No tenderness to palpation of the T. or L-spine.  Deformity of left wrist with dorsal angulation. Intact radial pulse. Cardinal hand movements intact.   Abrasion the left medial malleolus, abrasion to left proximal shin  Neurological: He is alert and oriented to person, place, and time. No cranial nerve deficit. He exhibits normal  muscle tone. Coordination normal.  Skin: Skin is warm.    ED Course  Procedures (including critical care time)  Labs Reviewed  COMPREHENSIVE METABOLIC PANEL - Abnormal; Notable for the following:    Glucose, Bld 130 (*)    Creatinine, Ser 1.64 (*)    GFR calc non Af Amer 44 (*)    GFR calc Af Amer 51 (*)    All other components within normal limits  PROTIME-INR  CBC WITH DIFFERENTIAL  URINALYSIS, ROUTINE W REFLEX MICROSCOPIC   Dg Chest 2 View  11/22/2012  *RADIOLOGY REPORT*  Clinical Data: Fall.  CHEST - 2 VIEW  Comparison: None  Findings: The heart size and mediastinal contours are within normal limits.  Both lungs are clear.  The visualized skeletal structures are unremarkable.  IMPRESSION: Negative exam.   Original Report Authenticated By: Signa Kell, M.D.    Dg Wrist Complete Left  11/22/2012  *RADIOLOGY REPORT*  Clinical Data: Fall.  Wrist pain  LEFT WRIST - COMPLETE 3+ VIEW  Comparison: None  Findings: There is a comminuted, impacted, intra-articular fracture of the distal radius.  There is dorsal angulation of the distal fracture fragments.  No dislocation.  IMPRESSION:  1.  Comminuted, impacted, intra-articular fracture of the distal radius.   Original Report Authenticated By: Signa Kell, M.D.    Dg Tibia/fibula Left  11/22/2012  *RADIOLOGY REPORT*  Clinical Data: Fall from ladder with left leg injuries.  LEFT TIBIA AND FIBULA - 2 VIEW  Comparison: None.  Findings: No acute fracture or dislocation identified.  Soft tissues are unremarkable.  IMPRESSION: No acute fracture.   Original Report Authenticated By: Irish Lack, M.D.    Dg Ankle Complete Left  11/22/2012  *RADIOLOGY REPORT*  Clinical Data:  Fall from ladder with left lower extremity injury.  LEFT ANKLE COMPLETE - 3+ VIEW  Comparison:  None.  Findings:  There is no evidence of fracture, dislocation, or joint effusion.  There is no evidence of arthropathy or other focal bone abnormality.  Soft tissues are unremarkable.   IMPRESSION: Negative.   Original Report Authenticated By: Irish Lack, M.D.    Dg Knee Complete 4 Views Left  11/22/2012  *RADIOLOGY REPORT*  Clinical Data: Fall from ladder with left lower extremity injuries.  LEFT KNEE - COMPLETE 4+ VIEW  Comparison:  None.  Findings:  There is no evidence of fracture, dislocation, or joint effusion.  There is no evidence of arthropathy or other focal bone abnormality.  Soft tissues are unremarkable.  IMPRESSION: Negative.   Original Report Authenticated By: Irish Lack, M.D.      No diagnosis found.    MDM  Fall from 18 feet with left wrist  and ankle pain. Uncertain loss of consciousness. No use of anticoagulants. Hemodynamically stable. No abdominal pain or chest pain.  Trauma imaging ordered.  Comminuted and impacted distal radius fracture with dorsal angulation and translation. Dr. Janee Morn will come and evaluate. No other apparent injuries and imaging. Pain control. Pulse remains intact.    Date: 11/22/2012  Rate: 67  Rhythm: normal sinus rhythm  QRS Axis: normal  Intervals: normal  ST/T Wave abnormalities: normal  Conduction Disutrbances:none  Narrative Interpretation:   Old EKG Reviewed: none available       Glynn Octave, MD 11/22/12 1551

## 2012-11-22 NOTE — ED Notes (Signed)
PT fell from 18 foot ladder when feet slid out from under ladder.  Pt states he landed on L side.  Complains of L wrist pain with deformity.  Also complains of L ankle pain.  Pt states he has sob, lung sounds equal and clear.  99% on RA. Radial pulse moderate.

## 2012-11-22 NOTE — Consult Note (Signed)
ORTHOPAEDIC CONSULTATION  REQUESTING PHYSICIAN: Nelia Shi, MD  Chief Complaint: Left wrist pain and deformity  HPI: Raymond Snow is a 62 y.o. male who complains of  left wrist pain and deformity after falling 15-18 feet off a ladder onto his outstretched hand. He denies pain any other places, including the elbow and shoulder ipsilaterally. He denies numbness and tingling.  Past Medical History  Diagnosis Date  . HTN (hypertension)   . Rosacea   . Genital herpes     on the leg  . Diverticulosis     colon 2009 Dr Tedra [place]  . Tinnitus    Past Surgical History  Procedure Laterality Date  . Tonsillectomy     History   Social History  . Marital Status: Married    Spouse Name: N/A    Number of Children: N/A  . Years of Education: N/A   Occupational History  . Director of [employer]     Social History Main Topics  . Smoking status: Former Smoker    Quit date: 08/20/1986  . Smokeless tobacco: None  . Alcohol Use: Yes  . Drug Use: No  . Sexually Active: Yes   Other Topics Concern  . None   Social History Narrative   Regular exercise - NO         Family History  Problem Relation Age of Onset  . Diverticulitis Father   . Diabetes Father   . Diabetes Mother    No Known Allergies Prior to Admission medications   Medication Sig Start Date End Date Taking? Authorizing Provider  amLODipine (NORVASC) 10 MG tablet Take 10 mg by mouth every morning.   Yes Historical Provider, MD  aspirin 81 MG chewable tablet Chew 81 mg by mouth daily.     Yes Historical Provider, MD  valACYclovir (VALTREX) 1000 MG tablet Take 500 mg by mouth 2 (two) times daily. 06/18/12  Yes Georgina Quint Plotnikov, MD  valsartan (DIOVAN) 320 MG tablet Take 320 mg by mouth every morning.   Yes Historical Provider, MD  zolpidem (AMBIEN) 10 MG tablet Take 10 mg by mouth at bedtime as needed for sleep. 06/18/12  Yes Georgina Quint Plotnikov, MD  oxyCODONE (ROXICODONE) 5 MG immediate release tablet Take  1-2 tablets (5-10 mg total) by mouth every 4 (four) hours as needed for pain. 11/22/12   Jodi Marble, MD   Dg Chest 2 View  11/22/2012  *RADIOLOGY REPORT*  Clinical Data: Fall.  CHEST - 2 VIEW  Comparison: None  Findings: The heart size and mediastinal contours are within normal limits.  Both lungs are clear.  The visualized skeletal structures are unremarkable.  IMPRESSION: Negative exam.   Original Report Authenticated By: Signa Kell, M.D.    Dg Wrist Complete Left  11/22/2012  *RADIOLOGY REPORT*  Clinical Data: Fall.  Wrist pain  LEFT WRIST - COMPLETE 3+ VIEW  Comparison: None  Findings: There is a comminuted, impacted, intra-articular fracture of the distal radius.  There is dorsal angulation of the distal fracture fragments.  No dislocation.  IMPRESSION:  1.  Comminuted, impacted, intra-articular fracture of the distal radius.   Original Report Authenticated By: Signa Kell, M.D.    Dg Tibia/fibula Left  11/22/2012  *RADIOLOGY REPORT*  Clinical Data: Fall from ladder with left leg injuries.  LEFT TIBIA AND FIBULA - 2 VIEW  Comparison: None.  Findings: No acute fracture or dislocation identified.  Soft tissues are unremarkable.  IMPRESSION: No acute fracture.   Original Report Authenticated By:  Irish Lack, M.D.    Dg Ankle Complete Left  11/22/2012  *RADIOLOGY REPORT*  Clinical Data:  Fall from ladder with left lower extremity injury.  LEFT ANKLE COMPLETE - 3+ VIEW  Comparison:  None.  Findings:  There is no evidence of fracture, dislocation, or joint effusion.  There is no evidence of arthropathy or other focal bone abnormality.  Soft tissues are unremarkable.  IMPRESSION: Negative.   Original Report Authenticated By: Irish Lack, M.D.    Ct Head Wo Contrast  11/22/2012  *RADIOLOGY REPORT*  Clinical Data:  Fall from ladder with multiple injuries.  CT HEAD WITHOUT CONTRAST CT CERVICAL SPINE WITHOUT CONTRAST  Technique:  Multidetector CT imaging of the head and cervical spine was  performed following the standard protocol without intravenous contrast.  Multiplanar CT image reconstructions of the cervical spine were also generated.  Comparison:   None  CT HEAD  Findings: Mild small vessel disease is noted in the periventricular white matter. The brain demonstrates no evidence of hemorrhage, infarction, edema, mass effect, extra-axial fluid collection, hydrocephalus or mass lesion.  The skull is unremarkable.  IMPRESSION: No acute findings.  Mild small vessel disease noted.  CT CERVICAL SPINE  Findings: The cervical spine shows normal alignment and no evidence of fracture or subluxation.  Minimal degenerative changes are present.  No soft tissue swelling.  The visualized airway is normally patent.  No incidental masses.  IMPRESSION: Normal cervical spine.   Original Report Authenticated By: Irish Lack, M.D.    Ct Chest W Contrast  11/22/2012  *RADIOLOGY REPORT*  Clinical Data:  Fall from ladder with multiple injuries.  CT CHEST, ABDOMEN AND PELVIS WITH CONTRAST  Technique:  Multidetector CT imaging of the chest, abdomen and pelvis was performed following the standard protocol during bolus administration of intravenous contrast.  Contrast: OMNIPAQUE IOHEXOL 300 MG/ML SOLN  Comparison:  10/31/2008  CT CHEST  Findings:  No evidence of pneumothorax, hemothorax, pulmonary contusion or bony fracture.  The airway is normally patent.  No nodules, masses or enlarged lymph nodes are identified.  Incidental small amount of calcified plaque is noted in the distribution of the LAD.  Heart size is normal.  IMPRESSION: No acute traumatic injury identified in the chest.  CT ABDOMEN AND PELVIS  Findings:  The liver, gallbladder, pancreas, spleen, adrenal glands and kidneys are within normal limits.  No free fluid is identified. Bowel is unremarkable.  Sigmoid diverticulosis present.  No hernias, masses or enlarged lymph nodes.  The bladder is unremarkable.  No fractures are identified.  IMPRESSION:  Normal CT of the abdomen and pelvis demonstrating no acute injuries.   Original Report Authenticated By: Irish Lack, M.D.    Ct Cervical Spine Wo Contrast  11/22/2012  *RADIOLOGY REPORT*  Clinical Data:  Fall from ladder with multiple injuries.  CT HEAD WITHOUT CONTRAST CT CERVICAL SPINE WITHOUT CONTRAST  Technique:  Multidetector CT imaging of the head and cervical spine was performed following the standard protocol without intravenous contrast.  Multiplanar CT image reconstructions of the cervical spine were also generated.  Comparison:   None  CT HEAD  Findings: Mild small vessel disease is noted in the periventricular white matter. The brain demonstrates no evidence of hemorrhage, infarction, edema, mass effect, extra-axial fluid collection, hydrocephalus or mass lesion.  The skull is unremarkable.  IMPRESSION: No acute findings.  Mild small vessel disease noted.  CT CERVICAL SPINE  Findings: The cervical spine shows normal alignment and no evidence of fracture or subluxation.  Minimal degenerative changes are present.  No soft tissue swelling.  The visualized airway is normally patent.  No incidental masses.  IMPRESSION: Normal cervical spine.   Original Report Authenticated By: Irish Lack, M.D.    Ct Abdomen Pelvis W Contrast  11/22/2012  *RADIOLOGY REPORT*  Clinical Data:  Fall from ladder with multiple injuries.  CT CHEST, ABDOMEN AND PELVIS WITH CONTRAST  Technique:  Multidetector CT imaging of the chest, abdomen and pelvis was performed following the standard protocol during bolus administration of intravenous contrast.  Contrast: OMNIPAQUE IOHEXOL 300 MG/ML SOLN  Comparison:  10/31/2008  CT CHEST  Findings:  No evidence of pneumothorax, hemothorax, pulmonary contusion or bony fracture.  The airway is normally patent.  No nodules, masses or enlarged lymph nodes are identified.  Incidental small amount of calcified plaque is noted in the distribution of the LAD.  Heart size is normal.   IMPRESSION: No acute traumatic injury identified in the chest.  CT ABDOMEN AND PELVIS  Findings:  The liver, gallbladder, pancreas, spleen, adrenal glands and kidneys are within normal limits.  No free fluid is identified. Bowel is unremarkable.  Sigmoid diverticulosis present.  No hernias, masses or enlarged lymph nodes.  The bladder is unremarkable.  No fractures are identified.  IMPRESSION: Normal CT of the abdomen and pelvis demonstrating no acute injuries.   Original Report Authenticated By: Irish Lack, M.D.    Dg Knee Complete 4 Views Left  11/22/2012  *RADIOLOGY REPORT*  Clinical Data: Fall from ladder with left lower extremity injuries.  LEFT KNEE - COMPLETE 4+ VIEW  Comparison:  None.  Findings:  There is no evidence of fracture, dislocation, or joint effusion.  There is no evidence of arthropathy or other focal bone abnormality.  Soft tissues are unremarkable.  IMPRESSION: Negative.   Original Report Authenticated By: Irish Lack, M.D.     Positive ROS: All other systems have been reviewed and were otherwise negative with the exception of those mentioned in the HPI and as above.  Physical Exam: Vitals: Refer to EMR. Constitutional:  WD, WN, NAD HEENT:  NCAT, EOMI Neuro/Psych:  Alert & oriented to person, place, and time; appropriate mood & affect Lymphatic: No generalized UE edema or lymphadenopathy Extremities / MSK:  The extremities are normal with respect to appearance, ranges of motion, joint stability, muscle strength/tone, sensation, & perfusion except as otherwise noted:   Left wrist enlarged consistent with acute hematoma. There does appear to be dorsal displacement of the hand and wrist, forearm axis. He has intact light touch sensation in the radial, ulnar, and median nerve distributions. He fires [employer], finger flexors, first DI, and MCP extensors. Fingers are warm with brisk capillary refill. He has full elbow flexion and extension without elbow pain. No tenderness to  palpation about the elbow. There is a small abrasion on the ulnar aspect of the wrist. This does not appear to be deep, it does not communicate with the radius fracture.  Assessment: Displaced comminuted intra-articular left distal radius fracture  Plan: Following installation of hematoma block with 10 of lidocaine, the patient was placed into fingertrap traction with 5 pounds. After 10 minutes, gentle manipulative reduction was performed and a sugar tong splint was applied. Postreduction neurovascular exam unchanged. Postreduction radiographs revealed improved gross alignment. He'll be discharged with appropriate elevations regarding compartment syndrome, acute carpal tunnel syndrome, splint care, etc. I also discussed with him the goals, risks, and benefits of operative treatment to obtain and maintain acceptable reduction. I explained to  him the rationale for performing this in a delayed manner, likely 7-10 days out. He'll have discharge instructions include calling her surgical coordinator on Monday to make further arrangements. Questions were invited and answered.

## 2012-11-25 ENCOUNTER — Encounter (HOSPITAL_BASED_OUTPATIENT_CLINIC_OR_DEPARTMENT_OTHER): Payer: Self-pay | Admitting: *Deleted

## 2012-11-25 ENCOUNTER — Other Ambulatory Visit: Payer: Self-pay | Admitting: Orthopedic Surgery

## 2012-12-01 ENCOUNTER — Encounter (HOSPITAL_BASED_OUTPATIENT_CLINIC_OR_DEPARTMENT_OTHER): Payer: Self-pay | Admitting: Certified Registered Nurse Anesthetist

## 2012-12-01 ENCOUNTER — Ambulatory Visit (HOSPITAL_BASED_OUTPATIENT_CLINIC_OR_DEPARTMENT_OTHER): Payer: 59 | Admitting: Certified Registered Nurse Anesthetist

## 2012-12-01 ENCOUNTER — Ambulatory Visit (HOSPITAL_BASED_OUTPATIENT_CLINIC_OR_DEPARTMENT_OTHER)
Admission: RE | Admit: 2012-12-01 | Discharge: 2012-12-01 | Disposition: A | Discharge status: Home / Self Care | Payer: 59 | Source: Ambulatory Visit | Attending: Orthopedic Surgery | Admitting: Orthopedic Surgery

## 2012-12-01 ENCOUNTER — Encounter (HOSPITAL_BASED_OUTPATIENT_CLINIC_OR_DEPARTMENT_OTHER)
Admission: RE | Disposition: A | Discharge status: Home / Self Care | Payer: Self-pay | Source: Ambulatory Visit | Attending: Orthopedic Surgery

## 2012-12-01 ENCOUNTER — Ambulatory Visit (HOSPITAL_COMMUNITY): Payer: 59

## 2012-12-01 DIAGNOSIS — S52599A Other fractures of lower end of unspecified radius, initial encounter for closed fracture: Secondary | ICD-10-CM | POA: Insufficient documentation

## 2012-12-01 DIAGNOSIS — X58XXXA Exposure to other specified factors, initial encounter: Secondary | ICD-10-CM | POA: Insufficient documentation

## 2012-12-01 HISTORY — DX: Personal history of other diseases of the digestive system: Z87.19

## 2012-12-01 HISTORY — PX: OPEN REDUCTION INTERNAL FIXATION (ORIF) DISTAL RADIAL FRACTURE: SHX5989

## 2012-12-01 LAB — POCT HEMOGLOBIN-HEMACUE: Hemoglobin: 16.7 g/dL (ref 13.0–17.0)

## 2012-12-01 SURGERY — OPEN REDUCTION INTERNAL FIXATION (ORIF) DISTAL RADIUS FRACTURE
Anesthesia: General | Site: Wrist | Laterality: Left | Wound class: Clean

## 2012-12-01 MED ORDER — OXYCODONE-ACETAMINOPHEN 5-325 MG PO TABS: 1.0000 | ORAL_TABLET | ORAL | Status: DC | PRN

## 2012-12-01 MED ORDER — MIDAZOLAM HCL 2 MG/2ML IJ SOLN
1.0000 mg | INTRAMUSCULAR | Status: DC | PRN
  Administered 2012-12-01: 2 mg via INTRAVENOUS

## 2012-12-01 MED ORDER — BUPIVACAINE-EPINEPHRINE PF 0.5-1:200000 % IJ SOLN
INTRAMUSCULAR | Status: DC | PRN
  Administered 2012-12-01: 25 mL

## 2012-12-01 MED ORDER — HYDROCODONE-ACETAMINOPHEN 5-325 MG PO TABS: 1.0000 | ORAL_TABLET | ORAL | Status: DC | PRN

## 2012-12-01 MED ORDER — CEFAZOLIN SODIUM-DEXTROSE 2-3 GM-% IV SOLR
2.0000 g | INTRAVENOUS | Status: AC
  Administered 2012-12-01: 2 g via INTRAVENOUS

## 2012-12-01 MED ORDER — OXYCODONE HCL 5 MG PO TABS: 5.0000 mg | ORAL_TABLET | Freq: Once | ORAL | Status: DC | PRN

## 2012-12-01 MED ORDER — LACTATED RINGERS IV SOLN: INTRAVENOUS | Status: DC

## 2012-12-01 MED ORDER — HYDROMORPHONE HCL PF 1 MG/ML IJ SOLN: 0.5000 mg | INTRAMUSCULAR | Status: DC | PRN

## 2012-12-01 MED ORDER — LIDOCAINE HCL (CARDIAC) 20 MG/ML IV SOLN
INTRAVENOUS | Status: DC | PRN
  Administered 2012-12-01: 30 mg via INTRAVENOUS

## 2012-12-01 MED ORDER — LACTATED RINGERS IV SOLN
INTRAVENOUS | Status: DC
  Administered 2012-12-01: 07:00:00 via INTRAVENOUS
  Administered 2012-12-01: 20 mL/h via INTRAVENOUS
  Administered 2012-12-01: 08:00:00 via INTRAVENOUS

## 2012-12-01 MED ORDER — ONDANSETRON HCL 4 MG/2ML IJ SOLN
INTRAMUSCULAR | Status: DC | PRN
  Administered 2012-12-01: 4 mg via INTRAVENOUS

## 2012-12-01 MED ORDER — DEXAMETHASONE SODIUM PHOSPHATE 10 MG/ML IJ SOLN
INTRAMUSCULAR | Status: DC | PRN
  Administered 2012-12-01: 4 mg via INTRAVENOUS

## 2012-12-01 MED ORDER — FENTANYL CITRATE 0.05 MG/ML IJ SOLN
50.0000 ug | INTRAMUSCULAR | Status: DC | PRN
  Administered 2012-12-01: 100 ug via INTRAVENOUS

## 2012-12-01 MED ORDER — OXYCODONE HCL 5 MG PO TABS: 5.0000 mg | ORAL_TABLET | ORAL | Status: DC | PRN

## 2012-12-01 MED ORDER — HYDROMORPHONE HCL PF 1 MG/ML IJ SOLN: 0.2500 mg | INTRAMUSCULAR | Status: DC | PRN

## 2012-12-01 MED ORDER — MIDAZOLAM HCL 5 MG/5ML IJ SOLN
INTRAMUSCULAR | Status: DC | PRN
  Administered 2012-12-01: 1 mg via INTRAVENOUS

## 2012-12-01 MED ORDER — OXYCODONE HCL 5 MG/5ML PO SOLN
5.0000 mg | Freq: Once | ORAL | Status: DC | PRN
Start: 2012-12-01 — End: 2012-12-01

## 2012-12-01 MED ORDER — PROPOFOL 10 MG/ML IV BOLUS
INTRAVENOUS | Status: DC | PRN
  Administered 2012-12-01: 200 mg via INTRAVENOUS

## 2012-12-01 MED ORDER — DEXAMETHASONE SODIUM PHOSPHATE 4 MG/ML IJ SOLN
INTRAMUSCULAR | Status: DC | PRN
  Administered 2012-12-01: 4 mg

## 2012-12-01 MED ORDER — EPHEDRINE SULFATE 50 MG/ML IJ SOLN
INTRAMUSCULAR | Status: DC | PRN
  Administered 2012-12-01: 10 mg via INTRAVENOUS

## 2012-12-01 MED ORDER — PROMETHAZINE HCL 25 MG/ML IJ SOLN
6.2500 mg | INTRAMUSCULAR | Status: DC | PRN
  Administered 2012-12-01: 6.25 mg via INTRAVENOUS

## 2012-12-01 SURGICAL SUPPLY — 65 items
BANDAGE COBAN STERILE 2 (GAUZE/BANDAGES/DRESSINGS) IMPLANT
BANDAGE GAUZE ELAST BULKY 4 IN (GAUZE/BANDAGES/DRESSINGS) ×4 IMPLANT
BIT DRILL 2 FAST STEP (BIT) ×1 IMPLANT
BIT DRILL 2.5X110 QC LCP DISP (BIT) IMPLANT
BIT DRILL 2.5X4 QC (BIT) ×1 IMPLANT
BLADE MINI RND TIP GREEN BEAV (BLADE) IMPLANT
BLADE SURG 15 STRL LF DISP TIS (BLADE) ×1 IMPLANT
BLADE SURG 15 STRL SS (BLADE) ×2
BNDG CMPR 9X4 STRL LF SNTH (GAUZE/BANDAGES/DRESSINGS) ×1
BNDG COHESIVE 4X5 TAN STRL (GAUZE/BANDAGES/DRESSINGS) ×2 IMPLANT
BNDG ESMARK 4X9 LF (GAUZE/BANDAGES/DRESSINGS) ×2 IMPLANT
BRUSH SCRUB EZ PLAIN DRY (MISCELLANEOUS) IMPLANT
CANISTER SUCTION 1200CC (MISCELLANEOUS) ×2 IMPLANT
CHLORAPREP W/TINT 26ML (MISCELLANEOUS) ×2 IMPLANT
CORDS BIPOLAR (ELECTRODE) ×2 IMPLANT
COVER MAYO STAND STRL (DRAPES) ×2 IMPLANT
COVER TABLE BACK 60X90 (DRAPES) ×2 IMPLANT
CUFF TOURNIQUET SINGLE 18IN (TOURNIQUET CUFF) ×1 IMPLANT
CUFF TOURNIQUET SINGLE 24IN (TOURNIQUET CUFF) IMPLANT
DRAPE C-ARM 42X72 X-RAY (DRAPES) ×2 IMPLANT
DRAPE EXTREMITY T 121X128X90 (DRAPE) ×2 IMPLANT
DRAPE SURG 17X23 STRL (DRAPES) ×2 IMPLANT
DRSG ADAPTIC 3X8 NADH LF (GAUZE/BANDAGES/DRESSINGS) ×2 IMPLANT
DRSG EMULSION OIL 3X3 NADH (GAUZE/BANDAGES/DRESSINGS) IMPLANT
GLOVE BIO SURGEON STRL SZ7.5 (GLOVE) ×2 IMPLANT
GLOVE BIOGEL PI IND STRL 7.0 (GLOVE) IMPLANT
GLOVE BIOGEL PI IND STRL 8 (GLOVE) ×1 IMPLANT
GLOVE BIOGEL PI INDICATOR 7.0 (GLOVE) ×2
GLOVE BIOGEL PI INDICATOR 8 (GLOVE) ×1
GLOVE ECLIPSE 6.5 STRL STRAW (GLOVE) ×1 IMPLANT
GOWN PREVENTION PLUS XLARGE (GOWN DISPOSABLE) ×3 IMPLANT
NDL HYPO 25X1 1.5 SAFETY (NEEDLE) IMPLANT
NEEDLE HYPO 25X1 1.5 SAFETY (NEEDLE) IMPLANT
NS IRRIG 1000ML POUR BTL (IV SOLUTION) ×2 IMPLANT
PACK BASIN DAY SURGERY FS (CUSTOM PROCEDURE TRAY) ×2 IMPLANT
PAD CAST 4YDX4 CTTN HI CHSV (CAST SUPPLIES) ×1 IMPLANT
PADDING CAST ABS 4INX4YD NS (CAST SUPPLIES) ×2
PADDING CAST ABS COTTON 4X4 ST (CAST SUPPLIES) IMPLANT
PADDING CAST COTTON 4X4 STRL (CAST SUPPLIES) ×2
PEG SUBCHONDRAL SMOOTH 2.0X18 (Peg) ×1 IMPLANT
PEG SUBCHONDRAL SMOOTH 2.0X20 (Peg) ×1 IMPLANT
PEG SUBCHONDRAL SMOOTH 2.0X22 (Peg) ×2 IMPLANT
PEG SUBCHONDRAL SMOOTH 2.0X26 (Peg) ×1 IMPLANT
PEG THREADED 2.5MMX24MM LONG (Peg) ×1 IMPLANT
PEG THREADED 2.5MMX26MM LONG (Peg) ×1 IMPLANT
PENCIL BUTTON HOLSTER BLD 10FT (ELECTRODE) ×2 IMPLANT
PLATE F3 FRAG HI STR Y (Plate) ×1 IMPLANT
PLATE SHORT 24.4X51.3 LT (Plate) ×1 IMPLANT
RUBBERBAND STERILE (MISCELLANEOUS) IMPLANT
SCREW BN 12X3.5XNS CORT TI (Screw) IMPLANT
SCREW CANC NONLOCK 2.5X28 (Screw) ×1 IMPLANT
SCREW CORT 3.5X12 (Screw) ×4 IMPLANT
SCREW CORT 3.5X14 LNG (Screw) ×1 IMPLANT
SCREW PEG 2.5X22 NONLOCK (Screw) ×2 IMPLANT
SLEEVE SCD COMPRESS KNEE MED (MISCELLANEOUS) ×2 IMPLANT
SPONGE GAUZE 4X4 12PLY (GAUZE/BANDAGES/DRESSINGS) ×2 IMPLANT
STOCKINETTE 4X48 STRL (DRAPES) ×2 IMPLANT
SUT VIC AB 2-0 CT3 27 (SUTURE) ×2 IMPLANT
SUT VICRYL 4-0 PS2 18IN ABS (SUTURE) ×1 IMPLANT
SUT VICRYL RAPIDE 4/0 PS 2 (SUTURE) ×2 IMPLANT
SYR BULB 3OZ (MISCELLANEOUS) ×2 IMPLANT
SYRINGE 10CC LL (SYRINGE) IMPLANT
TOWEL OR 17X24 6PK STRL BLUE (TOWEL DISPOSABLE) ×2 IMPLANT
TOWEL OR NON WOVEN STRL DISP B (DISPOSABLE) ×2 IMPLANT
UNDERPAD 30X30 INCONTINENT (UNDERPADS AND DIAPERS) ×2 IMPLANT

## 2012-12-01 NOTE — H&P (Signed)
Chief Complaint: Left wrist pain and deformity  HPI: Raymond Snow is a 61 y.o. male who complains of  left wrist pain and deformity after falling 15-18 feet off a ladder onto his outstretched hand. He denies pain any other places, including the elbow and shoulder ipsilaterally. He denies numbness and tingling.  Past Medical History  Diagnosis Date  . HTN (hypertension)   . Rosacea   . Genital herpes     on the leg  . Diverticulosis     colon 2009 Dr Tedra [place]  . Tinnitus   . H/O hiatal hernia    Past Surgical History  Procedure Laterality Date  . Tonsillectomy    . Colonoscopy  2010   History   Social History  . Marital Status: Married    Spouse Name: N/A    Number of Children: N/A  . Years of Education: N/A   Occupational History  . Director of [employer]     Social History Main Topics  . Smoking status: Former Smoker    Quit date: 08/20/1986  . Smokeless tobacco: Never Used  . Alcohol Use: Yes     Comment: occasional  . Drug Use: No  . Sexually Active: Yes   Other Topics Concern  . None   Social History Narrative   Regular exercise - NO         Family History  Problem Relation Age of Onset  . Diverticulitis Father   . Diabetes Father   . Diabetes Mother   . Diverticulitis Mother    No Known Allergies Prior to Admission medications   Medication Sig Start Date End Date Taking? Authorizing Provider  amLODipine (NORVASC) 10 MG tablet Take 10 mg by mouth every morning.   Yes Historical Provider, MD  aspirin 81 MG chewable tablet Chew 81 mg by mouth daily.     Yes Historical Provider, MD  valACYclovir (VALTREX) 1000 MG tablet Take 500 mg by mouth 2 (two) times daily. 06/18/12  Yes Georgina Quint Plotnikov, MD  valsartan (DIOVAN) 320 MG tablet Take 320 mg by mouth every morning.   Yes Historical Provider, MD  zolpidem (AMBIEN) 10 MG tablet Take 10 mg by mouth at bedtime as needed for sleep. 06/18/12  Yes Georgina Quint Plotnikov, MD  oxyCODONE (ROXICODONE) 5 MG  immediate release tablet Take 1-2 tablets (5-10 mg total) by mouth every 4 (four) hours as needed for pain. 11/22/12   Jodi Marble, MD   No results found.  Positive ROS: All other systems have been reviewed and were otherwise negative with the exception of those mentioned in the HPI and as above.  Physical Exam: Vitals: Refer to EMR. Constitutional:  WD, WN, NAD HEENT:  NCAT, EOMI Neuro/Psych:  Alert & oriented to person, place, and time; appropriate mood & affect Lymphatic: No generalized UE edema or lymphadenopathy Extremities / MSK:  The extremities are normal with respect to appearance, ranges of motion, joint stability, muscle strength/tone, sensation, & perfusion except as otherwise noted:  Today:  ST splint intact, NVI.  IN ED: Left wrist enlarged consistent with acute hematoma. There does appear to be dorsal displacement of the hand and wrist, forearm axis. He has intact light touch sensation in the radial, ulnar, and median nerve distributions. He fires FPL, finger flexors, first DI, and MCP extensors. Fingers are warm with brisk capillary refill. He has full elbow flexion and extension without elbow pain. No tenderness to palpation about the elbow. There is a small abrasion on the ulnar  aspect of the wrist. This does not appear to be deep, it does not communicate with the radius fracture.  Assessment: Displaced comminuted intra-articular left distal radius fracture  Plan: Following installation of hematoma block with 10 of lidocaine, the patient was placed into fingertrap traction with 5 pounds. After 10 minutes, gentle manipulative reduction was performed and a sugar tong splint was applied. Postreduction neurovascular exam unchanged. Postreduction radiographs revealed improved gross alignment. He'll be discharged with appropriate elevations regarding compartment syndrome, acute carpal tunnel syndrome, splint care, etc. I also discussed with him the goals, risks, and benefits of  operative treatment to obtain and maintain acceptable reduction. Questions were invited and answered.

## 2012-12-01 NOTE — Op Note (Signed)
12/01/2012  7:18 AM  PATIENT:  Raymond Snow  62 y.o. male  PRE-OPERATIVE DIAGNOSIS:  Displaced, comminuted left distal radius fracture  POST-OPERATIVE DIAGNOSIS:  Same  PROCEDURE:  ORIF displaced comminuted left distal radius fracture  SURGEON: Cliffton Asters. Janee Morn, MD  PHYSICIAN ASSISTANT: None  ANESTHESIA:  regional and general  SPECIMENS:  None  DRAINS:   None  PREOPERATIVE INDICATIONS:  Raymond Snow is a  62 y.o. male with a diagnosis of displaced, comminuted distal radius fracture  The risks benefits and alternatives were discussed with the patient preoperatively including but not limited to the risks of infection, bleeding, nerve injury, cardiopulmonary complications, the need for revision surgery, among others, and the patient verbalized understanding and consented to proceed.  OPERATIVE IMPLANTS: Biomet DVR plate & F3 T-plate  OPERATIVE FINDINGS: comminuted fx, with near anatomic alignment following reduction & fixation  OPERATIVE PROCEDURE:  After receiving prophylactic antibiotics and a regional block, the patient was escorted to the operative theatre and placed in a supine position.  General anesthesia was administered.  A surgical "time-out" was performed during which the planned procedure, proposed operative site, and the correct patient identity were compared to the operative consent and agreement confirmed by the circulating nurse according to current facility policy.  Following application of a tourniquet to the operative extremity, the exposed skin was scrub with Hibiclens scrub brush before being formally prepped with Chloraprep and draped in the usual sterile fashion.  The limb was exsanguinated with an Esmarch bandage and the tourniquet inflated to approximately higher than systolic BP.  A curvilinear incision was marked over the FCR and this was made sharply with a scalpel. Subcutaneous tissues dissected with blunt and spreading dissection. The FCR axis  was exploited deeply. The pronator quadratus was reflected in an L-shaped. It was reflected ulnarly. The fracture was identified and a short standard width plate applied to the volar aspect. Using fluoroscopic guidance the correct alignment and positioning of the plate was confirmed and it was secured to the shaft with 2 screws. Distally then the fragments were further reduced to the distal in the plate and the distal holes were filled sequentially with a mixture of threaded and smooth pegs. First the ulnar portion was secured to the plate, and the distal radial portion of the fracture fragments was reduced both to the plate, the remainder the radius, and the ulnar fragments and then it was secured to the plate. The final proximal hole was drilled and filled. Still has some concern about whether the dorsal fragments were acceptably reduced and even more is a were secured to the construct therefore a 2 limb zigzag incision was made dorsally. The EPL was taken out of its bed and retracted radially. Subperiosteal dissection was performed elevating portions of the third and fourth compartment. A T. plate from the F3 plate set was then placed dorsally with the 2 distal holes of the plate intentionally left open but used to secure reduction of the dorsal rim fragments. The plate was actually turned slightly obliquely to allow for the holes of the plate not to line up directly with the volar plate. Following application of the plate the articular surface appeared well reduced and the wrist was stable. DRUJ was assessed and found to be acceptably stable. The dorsal retinaculum was repaired leaving the EPL transposed and the pronator was repaired as well as could be done since some of it had been shredded in the course of the injury. This was done  with 2-0 Vicryl sutures. Tourniquet was released and additional hemostasis obtained and the wound is copiously irrigated. The skin was closed with combination 2-0 Vicryl deep  dermal buried sutures followed by running 4-0 Vicryl horizontal mattress suture and the skin.   A short arm splint dressing with a volar plaster component was applied and he was awakened and taken to room stable condition  DISPOSITION: He will be discharged home today with typical postop instructions, returning in 10-15 days for reevaluation with new x-rays of the left wrist including high lateral and transition to hand therapy for a custom splint fabrication and initiation of rehabilitation.

## 2012-12-01 NOTE — Progress Notes (Signed)
Assisted Dr. Kasik with left, ultrasound guided, supraclavicular block. Side rails up, monitors on throughout procedure. See vital signs in flow sheet. Tolerated Procedure well. 

## 2012-12-01 NOTE — Anesthesia Postprocedure Evaluation (Signed)
  Anesthesia Post-op Note  Patient: Raymond Snow  Procedure(s) Performed: Procedure(s): OPEN REDUCTION INTERNAL FIXATION (ORIF) LEFT DISTAL RADIAL FRACTURE (Left)  Patient Location: PACU  Anesthesia Type:GA combined with regional for post-op pain  Level of Consciousness: awake and alert   Airway and Oxygen Therapy: Patient Spontanous Breathing  Post-op Pain: none  Post-op Assessment: Post-op Vital signs reviewed, Patient's Cardiovascular Status Stable, Respiratory Function Stable, Patent Airway, No signs of Nausea or vomiting and Pain level controlled  Post-op Vital Signs: stable  Complications: No apparent anesthesia complications

## 2012-12-01 NOTE — Transfer of Care (Signed)
Immediate Anesthesia Transfer of Care Note  Patient: Raymond Snow  Procedure(s) Performed: Procedure(s): OPEN REDUCTION INTERNAL FIXATION (ORIF) LEFT DISTAL RADIAL FRACTURE (Left)  Patient Location: PACU  Anesthesia Type:GA combined with regional for post-op pain  Level of Consciousness: awake, alert , oriented and patient cooperative  Airway & Oxygen Therapy: Patient Spontanous Breathing and Patient connected to face mask oxygen  Post-op Assessment: Report given to PACU RN and Post -op Vital signs reviewed and stable  Post vital signs: Reviewed and stable  Complications: No apparent anesthesia complications

## 2012-12-01 NOTE — Anesthesia Preprocedure Evaluation (Addendum)
Anesthesia Evaluation  Patient identified by MRN, date of birth, ID band Patient awake    Reviewed: Allergy & Precautions, H&P , NPO status , Patient's Chart, lab work & pertinent test results  Airway Mallampati: II TM Distance: >3 FB Neck ROM: Full    Dental   Pulmonary shortness of breath, former smoker,  + rhonchi         Cardiovascular hypertension, Pt. on medications Rhythm:Regular Rate:Normal     Neuro/Psych    GI/Hepatic hiatal hernia,   Endo/Other    Renal/GU      Musculoskeletal   Abdominal (+) + obese,   Peds  Hematology   Anesthesia Other Findings   Reproductive/Obstetrics                          Anesthesia Physical Anesthesia Plan  ASA: II  Anesthesia Plan: General   Post-op Pain Management:    Induction: Intravenous  Airway Management Planned: LMA  Additional Equipment:   Intra-op Plan:   Post-operative Plan: Extubation in OR  Informed Consent: I have reviewed the patients History and Physical, chart, labs and discussed the procedure including the risks, benefits and alternatives for the proposed anesthesia with the patient or authorized representative who has indicated his/her understanding and acceptance.     Plan Discussed with: CRNA and Surgeon  Anesthesia Plan Comments:         Anesthesia Quick Evaluation

## 2012-12-01 NOTE — Anesthesia Procedure Notes (Addendum)
Anesthesia Regional Block:  Supraclavicular block  Pre-Anesthetic Checklist: ,, timeout performed, Correct Patient, Correct Site, Correct Laterality, Correct Procedure, Correct Position, site marked, Risks and benefits discussed,  Surgical consent,  Pre-op evaluation,  At surgeon's request and post-op pain management  Laterality: Left  Prep: chloraprep       Needles:  Injection technique: Single-shot  Needle Type: Echogenic Stimulator Needle     Needle Length: 5cm 5 cm Needle Gauge: 22 and 22 G    Additional Needles:  Procedures: ultrasound guided (picture in chart) and nerve stimulator Supraclavicular block  Nerve Stimulator or Paresthesia:  Response: 0.5 mA,   Additional Responses:   Narrative:  Start time: 12/01/2012 7:14 AM End time: 12/01/2012 7:21 AM Injection made incrementally with aspirations every 3 mL. Anesthesiologist: Dr Gypsy Balsam  Additional Notes: 1610-9604 L Supraclav N Block POP CHG prep, sterile tech #22 stim/echo needle w/goog [place] visualization-PIX in chart Marc .5% w/epi 1:200000 total 25cc+decadron 4mg  infiltrated Multiple neg asp No compl Dr Gypsy Balsam   Procedure Name: LMA Insertion Date/Time: 12/01/2012 7:31 AM Performed by: Alyssah Algeo D Pre-anesthesia Checklist: Patient identified, Emergency Drugs available, Suction available and Patient being monitored Patient Re-evaluated:Patient Re-evaluated prior to inductionOxygen Delivery Method: Circle System Utilized Preoxygenation: Pre-oxygenation with 100% oxygen Intubation Type: IV induction Ventilation: Mask ventilation without difficulty LMA: LMA inserted LMA Size: 5.0 Number of attempts: 1 Placement Confirmation: positive ETCO2 Tube secured with: Tape Dental Injury: Teeth and Oropharynx as per pre-operative assessment

## 2012-12-02 ENCOUNTER — Encounter (HOSPITAL_BASED_OUTPATIENT_CLINIC_OR_DEPARTMENT_OTHER): Payer: Self-pay | Admitting: Orthopedic Surgery

## 2012-12-26 ENCOUNTER — Telehealth: Payer: Self-pay

## 2012-12-26 MED ORDER — ZOLPIDEM TARTRATE 10 MG PO TABS: 10.0000 mg | ORAL_TABLET | Freq: Every evening | ORAL | Status: DC | PRN

## 2012-12-26 NOTE — Telephone Encounter (Signed)
Rx faxed to pharmacy, spouse advised

## 2012-12-26 NOTE — Telephone Encounter (Signed)
Pt is requesting a refill of Ambien. Per spouse, pt will be traveling out of town next week and needs medication to help with sleep especially with traveling.

## 2012-12-26 NOTE — Telephone Encounter (Signed)
Rx printed and placed on Side A for MD signature

## 2012-12-26 NOTE — Telephone Encounter (Signed)
OK to fill this prescription with additional refills x2 Thank you!  

## 2013-01-07 ENCOUNTER — Encounter: Payer: Self-pay | Admitting: Internal Medicine

## 2013-01-07 ENCOUNTER — Ambulatory Visit (INDEPENDENT_AMBULATORY_CARE_PROVIDER_SITE_OTHER): Payer: 59 | Admitting: Internal Medicine

## 2013-01-07 VITALS — BP 130/90 | HR 80 | Temp 98.3°F | Resp 16 | Wt 211.0 lb

## 2013-01-07 DIAGNOSIS — S62102D Fracture of unspecified carpal bone, left wrist, subsequent encounter for fracture with routine healing: Secondary | ICD-10-CM

## 2013-01-07 DIAGNOSIS — R6883 Chills (without fever): Secondary | ICD-10-CM

## 2013-01-07 DIAGNOSIS — S5290XD Unspecified fracture of unspecified forearm, subsequent encounter for closed fracture with routine healing: Secondary | ICD-10-CM

## 2013-01-07 DIAGNOSIS — S62102A Fracture of unspecified carpal bone, left wrist, initial encounter for closed fracture: Secondary | ICD-10-CM | POA: Insufficient documentation

## 2013-01-07 DIAGNOSIS — E785 Hyperlipidemia, unspecified: Secondary | ICD-10-CM

## 2013-01-07 DIAGNOSIS — I10 Essential (primary) hypertension: Secondary | ICD-10-CM

## 2013-01-07 MED ORDER — ZOLPIDEM TARTRATE 10 MG PO TABS: 10.0000 mg | ORAL_TABLET | Freq: Every evening | ORAL | Status: DC | PRN

## 2013-01-07 MED ORDER — AMLODIPINE BESYLATE 10 MG PO TABS: 10.0000 mg | ORAL_TABLET | Freq: Every morning | ORAL | Status: DC

## 2013-01-07 MED ORDER — VALSARTAN 320 MG PO TABS: 320.0000 mg | ORAL_TABLET | Freq: Every morning | ORAL | Status: DC

## 2013-01-07 MED ORDER — VALACYCLOVIR HCL 1 G PO TABS: 500.0000 mg | ORAL_TABLET | Freq: Two times a day (BID) | ORAL | Status: DC

## 2013-01-07 MED ORDER — VITAMIN D 1000 UNITS PO TABS: 1000.0000 [IU] | ORAL_TABLET | Freq: Every day | ORAL | Status: AC

## 2013-01-07 NOTE — Assessment & Plan Note (Addendum)
?   Due to Advil or brain injury vs other D/c advil Labs

## 2013-01-07 NOTE — Progress Notes (Signed)
   Subjective:   HPI  Raymond Snow off the ladder 16 ft on 11/22/12 on his L side.  12/01/12: s/p Open reduction internal fixation comminuted distal radius. There was no direct head injury. He couldn't see but red dots x 30 sec. C/o L ankle hematoma -- painful now C/o feeling cold all the time      Review of Systems  Constitutional: Negative for appetite change, fatigue and unexpected weight change.  HENT: Negative for nosebleeds, congestion, sore throat, sneezing, trouble swallowing and neck pain.   Eyes: Negative for itching and visual disturbance.  Respiratory: Negative for cough.   Cardiovascular: Negative for chest pain, palpitations and leg swelling.  Gastrointestinal: Negative for nausea, diarrhea, blood in stool and abdominal distention.  Genitourinary: Negative for frequency and hematuria.  Musculoskeletal: Negative for back pain, joint swelling and gait problem.  Skin: Negative for rash.  Neurological: Negative for dizziness, tremors, speech difficulty and weakness.  Psychiatric/Behavioral: Negative for sleep disturbance, dysphoric mood and agitation. The patient is not nervous/anxious.        Objective:   Physical Exam  Constitutional: He is oriented to person, place, and time. He appears well-developed and well-nourished. No distress.  HENT:  Head: Normocephalic and atraumatic.  Right Ear: External ear normal.  Left Ear: External ear normal.  Nose: Nose normal.  Mouth/Throat: Oropharynx is clear and moist. No oropharyngeal exudate.  Eyes: Conjunctivae and EOM are normal. Pupils are equal, round, and reactive to light. Right eye exhibits no discharge. Left eye exhibits no discharge. No scleral icterus.  Neck: Normal range of motion. Neck supple. No JVD present. No tracheal deviation present. No thyromegaly present.  Cardiovascular: Normal rate, regular rhythm, normal heart sounds and intact distal pulses.  Exam reveals no gallop and no friction rub.   No murmur  heard. Pulmonary/Chest: Effort normal and breath sounds normal. No stridor. No respiratory distress. He has no wheezes. He has no rales. He exhibits no tenderness.  Abdominal: Soft. Bowel sounds are normal. He exhibits no distension and no mass. There is no tenderness. There is no rebound and no guarding.  Genitourinary: Rectum normal, prostate normal and penis normal. Guaiac negative stool. No penile tenderness.  Musculoskeletal: Normal range of motion. He exhibits no edema and no tenderness.  Lymphadenopathy:    He has no cervical adenopathy.  Neurological: He is alert and oriented to person, place, and time. He has normal reflexes. No cranial nerve deficit. He exhibits normal muscle tone. Coordination normal.  Skin: Skin is warm and dry. No rash noted. He is not diaphoretic. No erythema. No pallor.  Psychiatric: He has a normal mood and affect. His behavior is normal. Judgment and thought content normal.  Toes - wnl L wrist is in a splint   Lab Results  Component Value Date   WBC 12.3* 11/22/2012   HGB 16.7 12/01/2012   HCT 46.2 11/22/2012   PLT PLATELET CLUMPS NOTED ON SMEAR, COUNT APPEARS ADEQUATE 11/22/2012   CHOL 214* 06/11/2012   TRIG 109.0 06/11/2012   HDL 59.60 06/11/2012   LDLDIRECT 136.9 06/11/2012   ALT 19 11/22/2012   AST 21 11/22/2012   NA 142 11/22/2012   K 3.6 11/22/2012   CL 105 11/22/2012   CREATININE 1.64* 11/22/2012   BUN 17 11/22/2012   CO2 24 11/22/2012   TSH 1.38 06/11/2012   PSA 1.00 06/11/2012   INR 0.97 11/22/2012        Assessment & Plan:

## 2013-01-07 NOTE — Patient Instructions (Signed)
Stop Advil, start Tylenol as needed

## 2013-01-07 NOTE — Assessment & Plan Note (Signed)
Continue with current prescription therapy as reflected on the Med list.  

## 2013-01-07 NOTE — Assessment & Plan Note (Signed)
Use Tylenol prn

## 2013-01-14 ENCOUNTER — Other Ambulatory Visit (INDEPENDENT_AMBULATORY_CARE_PROVIDER_SITE_OTHER): Payer: 59

## 2013-01-14 DIAGNOSIS — E785 Hyperlipidemia, unspecified: Secondary | ICD-10-CM

## 2013-01-14 DIAGNOSIS — R6883 Chills (without fever): Secondary | ICD-10-CM

## 2013-01-14 LAB — BASIC METABOLIC PANEL
CO2: 27 mEq/L (ref 19–32)
Chloride: 106 mEq/L (ref 96–112)
Glucose, Bld: 91 mg/dL (ref 70–99)
Potassium: 4.1 mEq/L (ref 3.5–5.1)
Sodium: 139 mEq/L (ref 135–145)

## 2013-01-14 LAB — URINALYSIS
Leukocytes, UA: NEGATIVE
Nitrite: NEGATIVE
Specific Gravity, Urine: 1.025 (ref 1.000–1.030)
Urine Glucose: NEGATIVE
Urobilinogen, UA: 0.2 (ref 0.0–1.0)

## 2013-01-14 LAB — HEPATIC FUNCTION PANEL
ALT: 15 U/L (ref 0–53)
Alkaline Phosphatase: 83 U/L (ref 39–117)
Bilirubin, Direct: 0.1 mg/dL (ref 0.0–0.3)
Total Bilirubin: 0.7 mg/dL (ref 0.3–1.2)
Total Protein: 7.1 g/dL (ref 6.0–8.3)

## 2013-01-14 LAB — CBC WITH DIFFERENTIAL/PLATELET
Basophils Absolute: 0 10*3/uL (ref 0.0–0.1)
Eosinophils Relative: 2.2 % (ref 0.0–5.0)
HCT: 42.9 % (ref 39.0–52.0)
Lymphocytes Relative: 23.2 % (ref 12.0–46.0)
Monocytes Relative: 5.5 % (ref 3.0–12.0)
Neutrophils Relative %: 68.5 % (ref 43.0–77.0)
Platelets: 231 10*3/uL (ref 150.0–400.0)
WBC: 7.8 10*3/uL (ref 4.5–10.5)

## 2013-01-14 LAB — TSH: TSH: 1.4 u[IU]/mL (ref 0.35–5.50)

## 2013-01-23 ENCOUNTER — Telehealth: Payer: Self-pay | Admitting: *Deleted

## 2013-01-23 DIAGNOSIS — Z0389 Encounter for observation for other suspected diseases and conditions ruled out: Secondary | ICD-10-CM

## 2013-01-23 DIAGNOSIS — Z0000 Encounter for general adult medical examination without abnormal findings: Secondary | ICD-10-CM

## 2013-01-23 NOTE — Telephone Encounter (Signed)
CPE labs entered.  

## 2013-01-23 NOTE — Telephone Encounter (Signed)
Message copied by Merrilyn Puma on Fri Jan 23, 2013 11:59 AM ------      Message from: Etheleen Sia      Created: Wed Jan 07, 2013  9:44 AM      Regarding: LAB       PHYSICAL LABS IN OCT ------

## 2013-04-24 ENCOUNTER — Other Ambulatory Visit: Payer: Self-pay | Admitting: Internal Medicine

## 2013-05-08 ENCOUNTER — Telehealth: Payer: Self-pay | Admitting: *Deleted

## 2013-05-08 MED ORDER — FLUTICASONE PROPIONATE 50 MCG/ACT NA SUSP: 1.0000 | Freq: Every day | NASAL | Status: DC | PRN

## 2013-05-08 NOTE — Telephone Encounter (Signed)
OK to fill this prescription with additional refills x11 Thank you!  

## 2013-05-08 NOTE — Telephone Encounter (Signed)
Rec fax requesting new Rx for Fluticasone 50 mcg nasal spray. 1-2 sprays each nostril daily. Ok to change?

## 2013-05-08 NOTE — Telephone Encounter (Signed)
Done

## 2013-06-01 ENCOUNTER — Other Ambulatory Visit (INDEPENDENT_AMBULATORY_CARE_PROVIDER_SITE_OTHER): Payer: 59

## 2013-06-01 DIAGNOSIS — Z0389 Encounter for observation for other suspected diseases and conditions ruled out: Secondary | ICD-10-CM

## 2013-06-01 DIAGNOSIS — Z0000 Encounter for general adult medical examination without abnormal findings: Secondary | ICD-10-CM

## 2013-06-01 LAB — CBC WITH DIFFERENTIAL/PLATELET
Basophils Relative: 0.8 % (ref 0.0–3.0)
Eosinophils Absolute: 0.2 10*3/uL (ref 0.0–0.7)
Eosinophils Relative: 2.2 % (ref 0.0–5.0)
HCT: 46.8 % (ref 39.0–52.0)
Lymphs Abs: 1.9 10*3/uL (ref 0.7–4.0)
MCHC: 33.4 g/dL (ref 30.0–36.0)
MCV: 93 fl (ref 78.0–100.0)
Monocytes Absolute: 0.5 10*3/uL (ref 0.1–1.0)
Neutrophils Relative %: 67.7 % (ref 43.0–77.0)
Platelets: 215 10*3/uL (ref 150.0–400.0)

## 2013-06-01 LAB — BASIC METABOLIC PANEL
CO2: 29 mEq/L (ref 19–32)
Calcium: 10 mg/dL (ref 8.4–10.5)
Chloride: 103 mEq/L (ref 96–112)
Glucose, Bld: 101 mg/dL — ABNORMAL HIGH (ref 70–99)
Sodium: 140 mEq/L (ref 135–145)

## 2013-06-01 LAB — URINALYSIS, ROUTINE W REFLEX MICROSCOPIC
Bilirubin Urine: NEGATIVE
Hgb urine dipstick: NEGATIVE
Ketones, ur: NEGATIVE
Total Protein, Urine: NEGATIVE
Urine Glucose: NEGATIVE
Urobilinogen, UA: 0.2 (ref 0.0–1.0)

## 2013-06-01 LAB — LIPID PANEL
Total CHOL/HDL Ratio: 4
Triglycerides: 165 mg/dL — ABNORMAL HIGH (ref 0.0–149.0)

## 2013-06-01 LAB — HEPATIC FUNCTION PANEL
ALT: 19 U/L (ref 0–53)
AST: 20 U/L (ref 0–37)
Albumin: 4.3 g/dL (ref 3.5–5.2)
Alkaline Phosphatase: 80 U/L (ref 39–117)
Bilirubin, Direct: 0.1 mg/dL (ref 0.0–0.3)
Total Protein: 7.7 g/dL (ref 6.0–8.3)

## 2013-06-01 LAB — LDL CHOLESTEROL, DIRECT: Direct LDL: 169.7 mg/dL

## 2013-06-08 ENCOUNTER — Encounter: Payer: Self-pay | Admitting: Internal Medicine

## 2013-06-08 ENCOUNTER — Ambulatory Visit (INDEPENDENT_AMBULATORY_CARE_PROVIDER_SITE_OTHER): Payer: 59 | Admitting: Internal Medicine

## 2013-06-08 VITALS — BP 124/90 | HR 80 | Temp 97.8°F | Resp 16 | Ht 67.5 in | Wt 217.0 lb

## 2013-06-08 DIAGNOSIS — I10 Essential (primary) hypertension: Secondary | ICD-10-CM

## 2013-06-08 DIAGNOSIS — Z0000 Encounter for general adult medical examination without abnormal findings: Secondary | ICD-10-CM

## 2013-06-08 DIAGNOSIS — E785 Hyperlipidemia, unspecified: Secondary | ICD-10-CM | POA: Insufficient documentation

## 2013-06-08 DIAGNOSIS — S62102S Fracture of unspecified carpal bone, left wrist, sequela: Secondary | ICD-10-CM

## 2013-06-08 DIAGNOSIS — Z23 Encounter for immunization: Secondary | ICD-10-CM

## 2013-06-08 DIAGNOSIS — S42309S Unspecified fracture of shaft of humerus, unspecified arm, sequela: Secondary | ICD-10-CM

## 2013-06-08 MED ORDER — ATORVASTATIN CALCIUM 10 MG PO TABS: 10.0000 mg | ORAL_TABLET | Freq: Every day | ORAL | Status: DC

## 2013-06-08 MED ORDER — AMLODIPINE BESYLATE 10 MG PO TABS: 10.0000 mg | ORAL_TABLET | Freq: Every morning | ORAL | Status: DC

## 2013-06-08 MED ORDER — VALSARTAN 320 MG PO TABS: 320.0000 mg | ORAL_TABLET | Freq: Every morning | ORAL | Status: DC

## 2013-06-08 MED ORDER — VALACYCLOVIR HCL 1 G PO TABS: 500.0000 mg | ORAL_TABLET | Freq: Two times a day (BID) | ORAL | Status: DC

## 2013-06-08 MED ORDER — FLUTICASONE PROPIONATE 50 MCG/ACT NA SUSP: 1.0000 | Freq: Every day | NASAL | Status: DC | PRN

## 2013-06-08 MED ORDER — ZOLPIDEM TARTRATE 10 MG PO TABS: 10.0000 mg | ORAL_TABLET | Freq: Every evening | ORAL | Status: DC | PRN

## 2013-06-08 NOTE — Assessment & Plan Note (Signed)
We discussed age appropriate health related issues, including available/recomended screening tests and vaccinations. We discussed a need for adhering to healthy diet and exercise. Labs/EKG were reviewed/ordered. All questions were answered.   

## 2013-06-08 NOTE — Assessment & Plan Note (Signed)
Healing well.

## 2013-06-08 NOTE — Assessment & Plan Note (Signed)
BP Readings from Last 3 Encounters:  06/08/13 124/90  01/07/13 130/90  12/01/12 112/59

## 2013-06-08 NOTE — Patient Instructions (Signed)
   Milk free trial (no milk, ice cream, cheese and yogurt) for 4-6 weeks. OK to use almond, coconut, rice or soy milk. "Almond breeze" brand tastes good.  

## 2013-06-08 NOTE — Progress Notes (Signed)
   Subjective:    HPI  The patient is here for a wellness exam. The patient has been doing well overall without major physical or psychological issues going on lately  BP Readings from Last 3 Encounters:  06/08/13 124/90  01/07/13 130/90  12/01/12 112/59   Wt Readings from Last 3 Encounters:  06/08/13 217 lb (98.431 kg)  01/07/13 211 lb (95.709 kg)  12/01/12 211 lb 2 oz (95.766 kg)       Review of Systems  Constitutional: Negative for appetite change, fatigue and unexpected weight change.  HENT: Negative for congestion, nosebleeds, sneezing, sore throat and trouble swallowing.   Eyes: Negative for itching and visual disturbance.  Respiratory: Negative for cough.   Cardiovascular: Negative for chest pain, palpitations and leg swelling.  Gastrointestinal: Negative for nausea, diarrhea, blood in stool and abdominal distention.  Genitourinary: Negative for frequency and hematuria.  Musculoskeletal: Negative for back pain, gait problem, joint swelling and neck pain.  Skin: Negative for rash.  Neurological: Negative for dizziness, tremors, speech difficulty and weakness.  Psychiatric/Behavioral: Negative for sleep disturbance, dysphoric mood and agitation. The patient is not nervous/anxious.        Objective:   Physical Exam  Constitutional: He is oriented to person, place, and time. He appears well-developed and well-nourished. No distress.  HENT:  Head: Normocephalic and atraumatic.  Right Ear: External ear normal.  Left Ear: External ear normal.  Nose: Nose normal.  Mouth/Throat: Oropharynx is clear and moist. No oropharyngeal exudate.  Eyes: Conjunctivae and EOM are normal. Pupils are equal, round, and reactive to light. Right eye exhibits no discharge. Left eye exhibits no discharge. No scleral icterus.  Neck: Normal range of motion. Neck supple. No JVD present. No tracheal deviation present. No thyromegaly present.  Cardiovascular: Normal rate, regular rhythm, normal  heart sounds and intact distal pulses.  Exam reveals no gallop and no friction rub.   No murmur heard. Pulmonary/Chest: Effort normal and breath sounds normal. No stridor. No respiratory distress. He has no wheezes. He has no rales. He exhibits no tenderness.  Abdominal: Soft. Bowel sounds are normal. He exhibits no distension and no mass. There is no tenderness. There is no rebound and no guarding.  Genitourinary: Rectum normal, prostate normal and penis normal. Guaiac negative stool. No penile tenderness.  Musculoskeletal: Normal range of motion. He exhibits no edema and no tenderness.  Lymphadenopathy:    He has no cervical adenopathy.  Neurological: He is alert and oriented to person, place, and time. He has normal reflexes. No cranial nerve deficit. He exhibits normal muscle tone. Coordination normal.  Skin: Skin is warm and dry. No rash noted. He is not diaphoretic. No erythema. No pallor.  Psychiatric: He has a normal mood and affect. His behavior is normal. Judgment and thought content normal.  Toes - wnl   Lab Results  Component Value Date   WBC 8.3 06/01/2013   HGB 15.7 06/01/2013   HCT 46.8 06/01/2013   PLT 215.0 06/01/2013   CHOL 251* 06/01/2013   TRIG 165.0* 06/01/2013   HDL 58.40 06/01/2013   LDLDIRECT 169.7 06/01/2013   ALT 19 06/01/2013   AST 20 06/01/2013   NA 140 06/01/2013   K 4.6 06/01/2013   CL 103 06/01/2013   CREATININE 1.3 06/01/2013   BUN 19 06/01/2013   CO2 29 06/01/2013   TSH 1.49 06/01/2013   PSA 0.52 06/01/2013   INR 0.97 11/22/2012        Assessment & Plan:

## 2013-06-08 NOTE — Assessment & Plan Note (Addendum)
Statins discussed - will try Lipitor  Mild

## 2013-08-31 ENCOUNTER — Other Ambulatory Visit (INDEPENDENT_AMBULATORY_CARE_PROVIDER_SITE_OTHER): Payer: 59

## 2013-08-31 DIAGNOSIS — S42309S Unspecified fracture of shaft of humerus, unspecified arm, sequela: Secondary | ICD-10-CM

## 2013-08-31 DIAGNOSIS — Z0000 Encounter for general adult medical examination without abnormal findings: Secondary | ICD-10-CM

## 2013-08-31 DIAGNOSIS — E785 Hyperlipidemia, unspecified: Secondary | ICD-10-CM

## 2013-08-31 DIAGNOSIS — Z23 Encounter for immunization: Secondary | ICD-10-CM

## 2013-08-31 DIAGNOSIS — I10 Essential (primary) hypertension: Secondary | ICD-10-CM

## 2013-08-31 DIAGNOSIS — S62102S Fracture of unspecified carpal bone, left wrist, sequela: Secondary | ICD-10-CM

## 2013-08-31 LAB — HEPATIC FUNCTION PANEL
ALT: 22 U/L (ref 0–53)
AST: 20 U/L (ref 0–37)
Albumin: 3.9 g/dL (ref 3.5–5.2)
Alkaline Phosphatase: 79 U/L (ref 39–117)
BILIRUBIN TOTAL: 0.6 mg/dL (ref 0.3–1.2)
Bilirubin, Direct: 0.1 mg/dL (ref 0.0–0.3)
TOTAL PROTEIN: 6.9 g/dL (ref 6.0–8.3)

## 2013-08-31 LAB — LIPID PANEL
CHOLESTEROL: 191 mg/dL (ref 0–200)
HDL: 46.4 mg/dL (ref >39.00)
LDL Cholesterol: 118 mg/dL — ABNORMAL HIGH (ref 0–99)
Total CHOL/HDL Ratio: 4
Triglycerides: 135 mg/dL (ref 0.0–149.0)
VLDL: 27 mg/dL (ref 0.0–40.0)

## 2013-09-07 ENCOUNTER — Encounter: Payer: Self-pay | Admitting: Internal Medicine

## 2013-09-07 ENCOUNTER — Ambulatory Visit (INDEPENDENT_AMBULATORY_CARE_PROVIDER_SITE_OTHER): Payer: 59 | Admitting: Internal Medicine

## 2013-09-07 VITALS — BP 122/80 | HR 84 | Temp 97.9°F | Resp 16 | Wt 216.0 lb

## 2013-09-07 DIAGNOSIS — I10 Essential (primary) hypertension: Secondary | ICD-10-CM

## 2013-09-07 DIAGNOSIS — IMO0001 Reserved for inherently not codable concepts without codable children: Secondary | ICD-10-CM

## 2013-09-07 DIAGNOSIS — M791 Myalgia, unspecified site: Secondary | ICD-10-CM

## 2013-09-07 DIAGNOSIS — E785 Hyperlipidemia, unspecified: Secondary | ICD-10-CM

## 2013-09-07 MED ORDER — FLUTICASONE PROPIONATE 50 MCG/ACT NA SUSP: 1.0000 | Freq: Every day | NASAL | Status: DC | PRN

## 2013-09-07 MED ORDER — ZOLPIDEM TARTRATE 10 MG PO TABS: 10.0000 mg | ORAL_TABLET | Freq: Every evening | ORAL | Status: DC | PRN

## 2013-09-07 MED ORDER — VALACYCLOVIR HCL 1 G PO TABS: 500.0000 mg | ORAL_TABLET | Freq: Two times a day (BID) | ORAL | Status: DC

## 2013-09-07 MED ORDER — AMLODIPINE BESYLATE 10 MG PO TABS: 10.0000 mg | ORAL_TABLET | Freq: Every morning | ORAL | Status: DC

## 2013-09-07 MED ORDER — VALSARTAN 320 MG PO TABS: 320.0000 mg | ORAL_TABLET | Freq: Every morning | ORAL | Status: DC

## 2013-09-07 NOTE — Assessment & Plan Note (Signed)
Mild due to Lipitor - ok with 2/wk dosing Start Vit D

## 2013-09-07 NOTE — Progress Notes (Signed)
   Subjective:    HPI  The patient presents for a follow-up of  chronic hypertension, herpes, chronic dyslipidemia - Lipitor - 2/wk - achy a little...  BP Readings from Last 3 Encounters:  09/07/13 122/80  06/08/13 124/90  01/07/13 130/90   Wt Readings from Last 3 Encounters:  09/07/13 216 lb (97.977 kg)  06/08/13 217 lb (98.431 kg)  01/07/13 211 lb (95.709 kg)       Review of Systems  Constitutional: Negative for appetite change, fatigue and unexpected weight change.  HENT: Negative for congestion, nosebleeds, sneezing, sore throat and trouble swallowing.   Eyes: Negative for itching and visual disturbance.  Respiratory: Negative for cough.   Cardiovascular: Negative for chest pain, palpitations and leg swelling.  Gastrointestinal: Negative for nausea, diarrhea, blood in stool and abdominal distention.  Genitourinary: Negative for frequency and hematuria.  Musculoskeletal: Negative for back pain, gait problem, joint swelling and neck pain.  Skin: Negative for rash.  Neurological: Negative for dizziness, tremors, speech difficulty and weakness.  Psychiatric/Behavioral: Negative for sleep disturbance, dysphoric mood and agitation. The patient is not nervous/anxious.        Objective:   Physical Exam  Constitutional: He is oriented to person, place, and time. He appears well-developed and well-nourished. No distress.  HENT:  Head: Normocephalic and atraumatic.  Right Ear: External ear normal.  Left Ear: External ear normal.  Nose: Nose normal.  Mouth/Throat: Oropharynx is clear and moist. No oropharyngeal exudate.  Eyes: Conjunctivae and EOM are normal. Pupils are equal, round, and reactive to light. Right eye exhibits no discharge. Left eye exhibits no discharge. No scleral icterus.  Neck: Normal range of motion. Neck supple. No JVD present. No tracheal deviation present. No thyromegaly present.  Cardiovascular: Normal rate, regular rhythm, normal heart sounds and  intact distal pulses.  Exam reveals no gallop and no friction rub.   No murmur heard. Pulmonary/Chest: Effort normal and breath sounds normal. No stridor. No respiratory distress. He has no wheezes. He has no rales. He exhibits no tenderness.  Abdominal: Soft. Bowel sounds are normal. He exhibits no distension and no mass. There is no tenderness. There is no rebound and no guarding.  Musculoskeletal: Normal range of motion. He exhibits no edema and no tenderness.  Lymphadenopathy:    He has no cervical adenopathy.  Neurological: He is alert and oriented to person, place, and time. He has normal reflexes. No cranial nerve deficit. He exhibits normal muscle tone. Coordination normal.  Skin: Skin is warm and dry. No rash noted. He is not diaphoretic. No erythema. No pallor.  Psychiatric: He has a normal mood and affect. His behavior is normal. Judgment and thought content normal.    Lab Results  Component Value Date   WBC 8.3 06/01/2013   HGB 15.7 06/01/2013   HCT 46.8 06/01/2013   PLT 215.0 06/01/2013   CHOL 191 08/31/2013   TRIG 135.0 08/31/2013   HDL 46.40 08/31/2013   LDLDIRECT 169.7 06/01/2013   ALT 22 08/31/2013   AST 20 08/31/2013   NA 140 06/01/2013   K 4.6 06/01/2013   CL 103 06/01/2013   CREATININE 1.3 06/01/2013   BUN 19 06/01/2013   CO2 29 06/01/2013   TSH 1.49 06/01/2013   PSA 0.52 06/01/2013   INR 0.97 11/22/2012        Assessment & Plan:

## 2013-09-07 NOTE — Progress Notes (Signed)
Pre visit review using our clinic review tool, if applicable. No additional management support is needed unless otherwise documented below in the visit note. 

## 2013-09-07 NOTE — Assessment & Plan Note (Signed)
Continue with current prescription therapy as reflected on the Med list.  

## 2013-09-21 ENCOUNTER — Telehealth: Payer: Self-pay | Admitting: *Deleted

## 2013-09-21 NOTE — Telephone Encounter (Signed)
OK to fill this prescription with additional refills x1 Thank you!  

## 2013-09-21 NOTE — Telephone Encounter (Signed)
Rf req for Zolpidem 10 mg 1 po qhs prn sleep. # 90. Last filled 06/15/2013. Ok to rf?

## 2013-09-22 MED ORDER — ZOLPIDEM TARTRATE 10 MG PO TABS: 10.0000 mg | ORAL_TABLET | Freq: Every evening | ORAL | Status: DC | PRN

## 2013-09-22 NOTE — Telephone Encounter (Signed)
Script faxed to pharmacy

## 2013-09-22 NOTE — Telephone Encounter (Signed)
Script printed.

## 2014-01-25 ENCOUNTER — Other Ambulatory Visit: Payer: Self-pay | Admitting: Internal Medicine

## 2014-03-08 IMAGING — CR DG CHEST 2V
2 series · 2 of 2 positions shown · non-contrast
Comparison: None

CLINICAL DATA: Fall.

CHEST - 2 VIEW

[x chest ap]
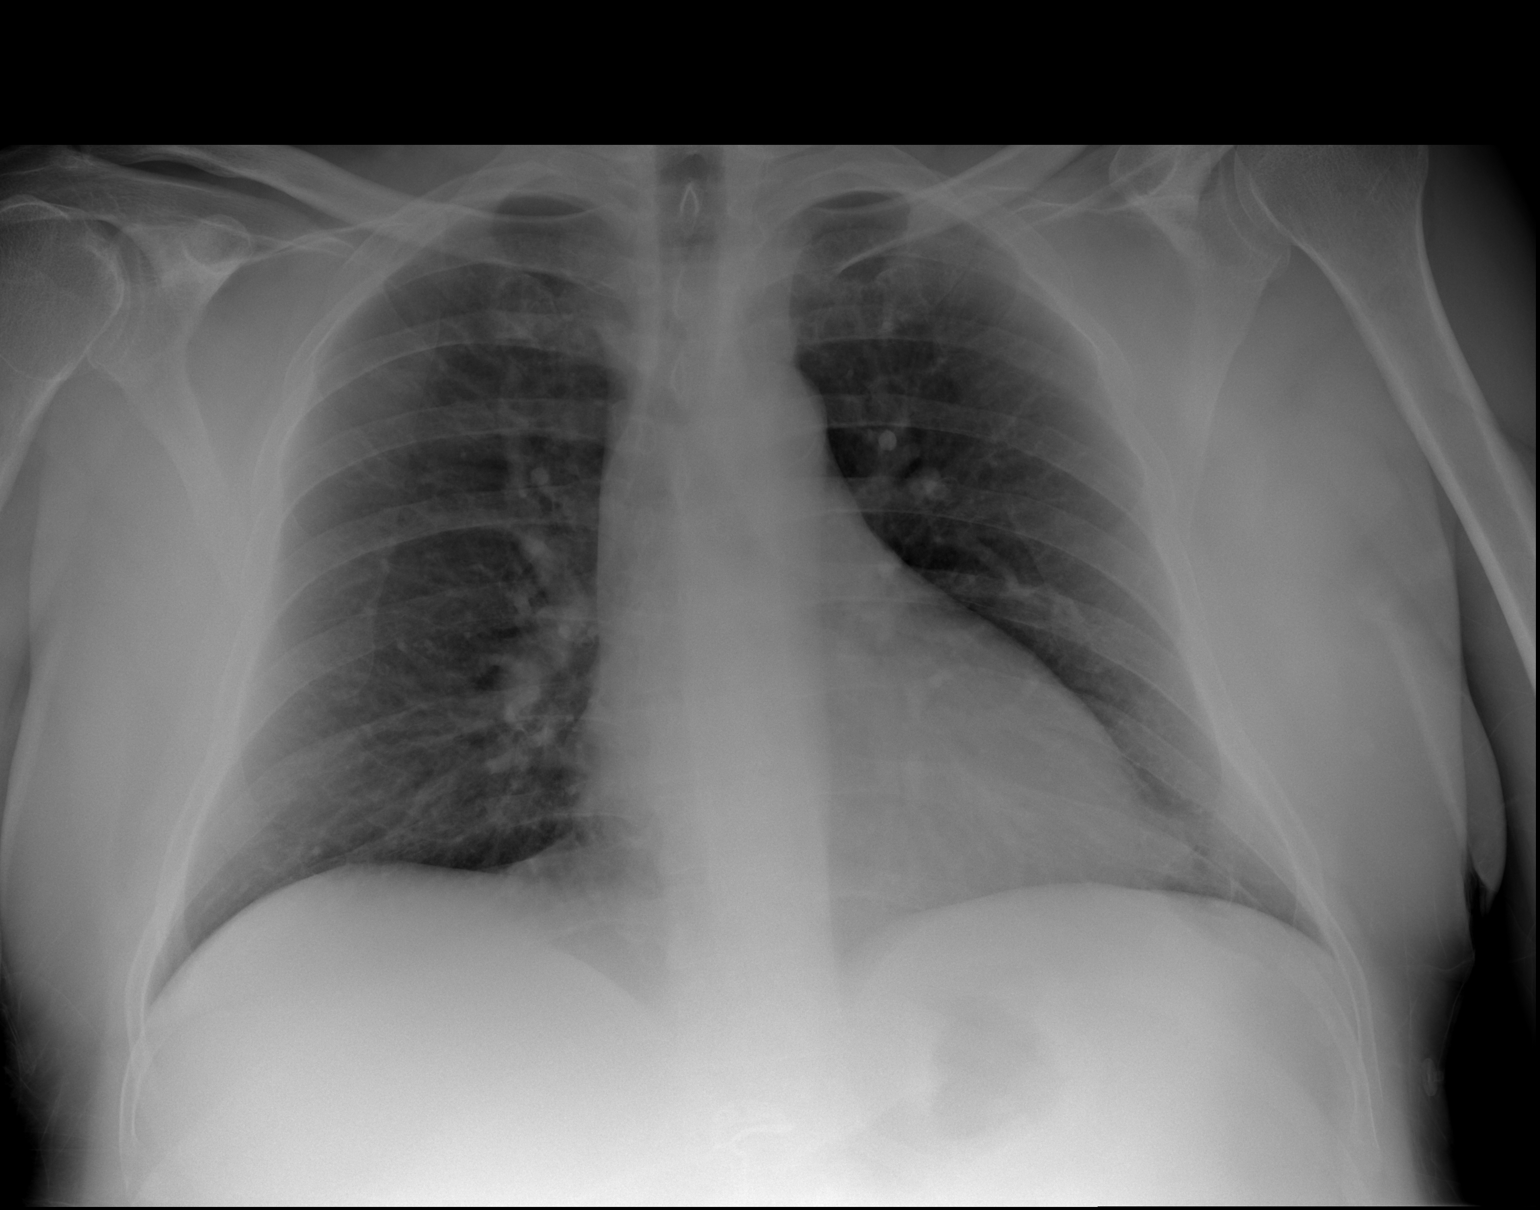

[w chest lat]
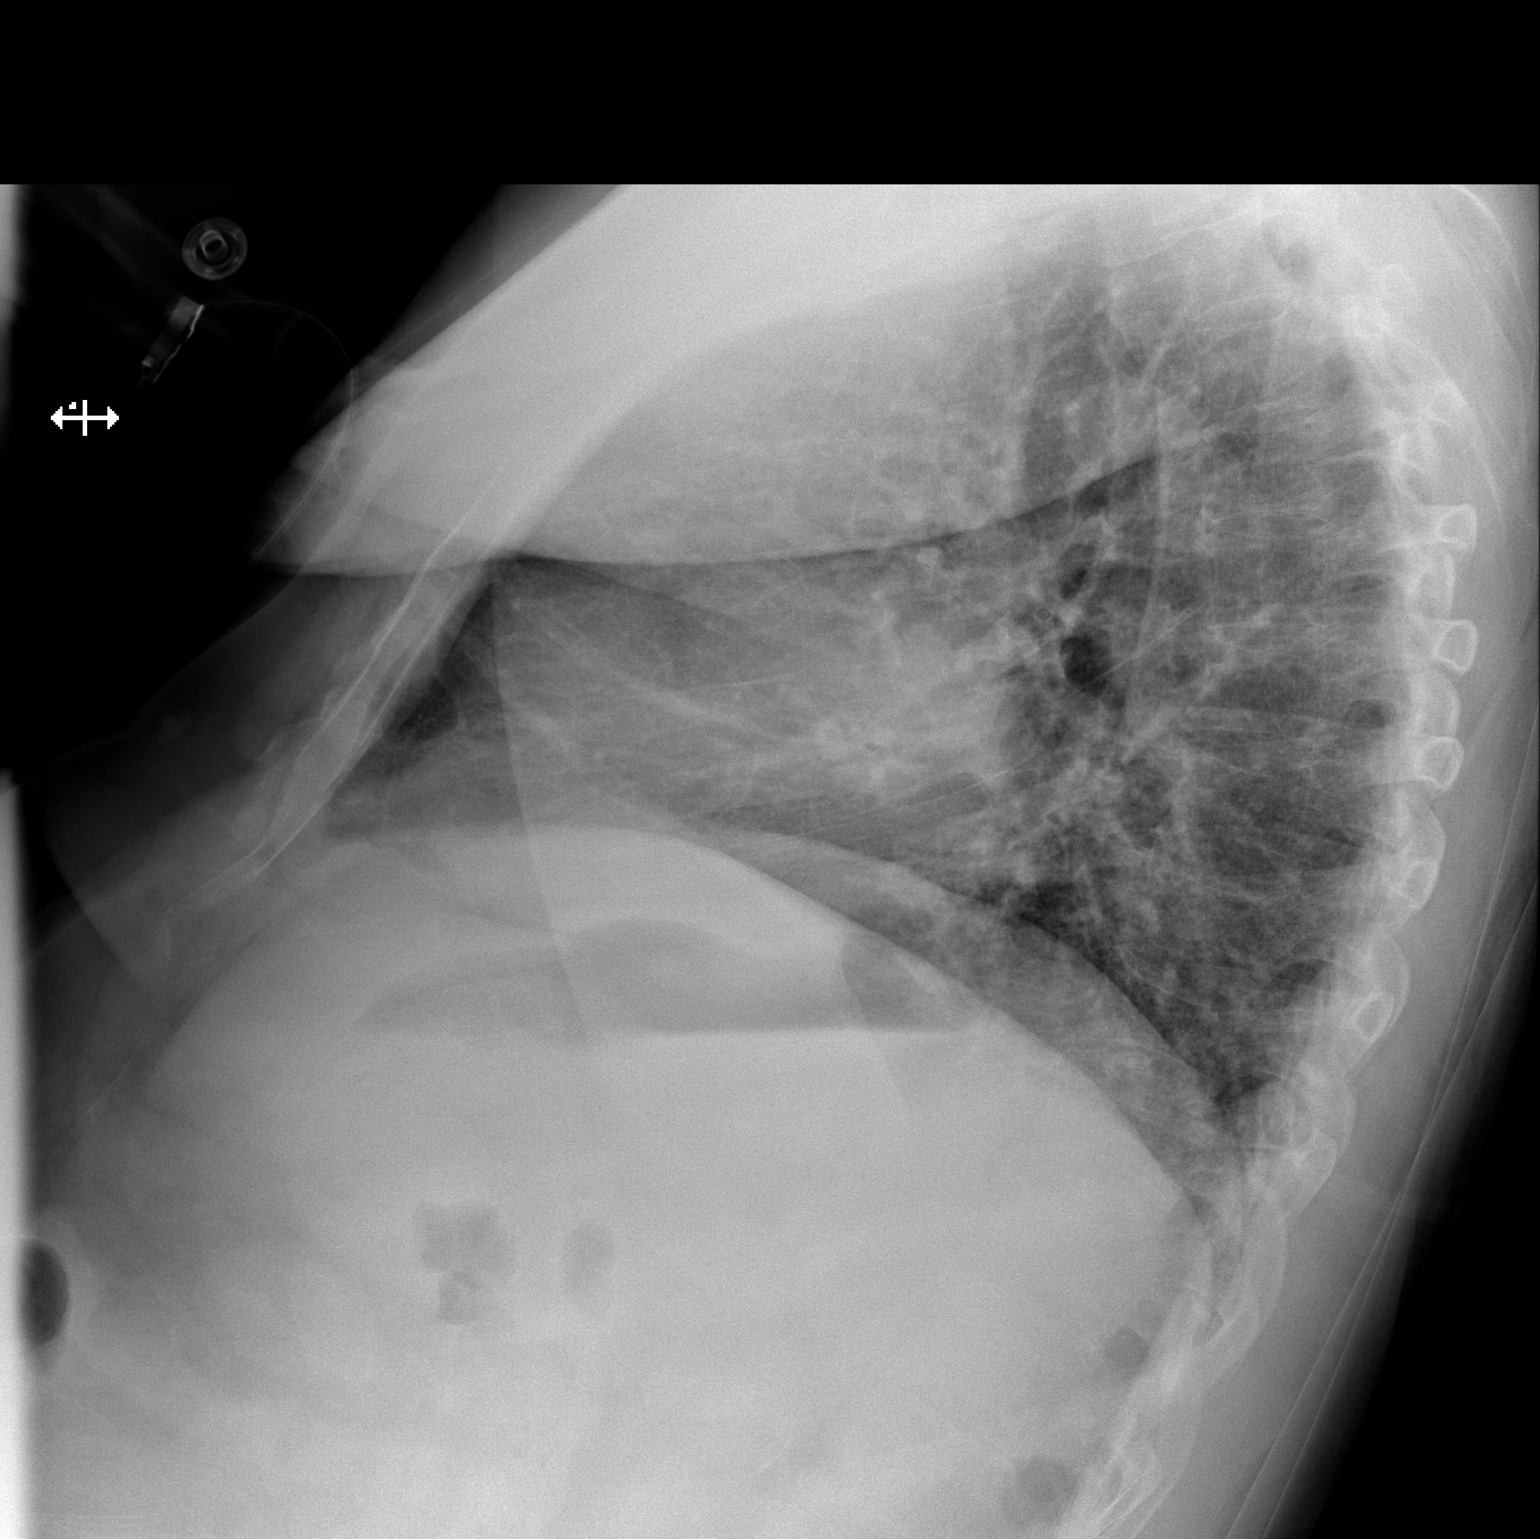

[2 of 2 positions shown; findings below may reference images not displayed]

FINDINGS: The heart size and mediastinal contours are within normal
limits.  Both lungs are clear.  The visualized skeletal structures
are unremarkable.
IMPRESSION: Negative exam.

## 2014-05-22 ENCOUNTER — Encounter: Payer: Self-pay | Admitting: Internal Medicine

## 2014-05-24 ENCOUNTER — Other Ambulatory Visit: Payer: Self-pay | Admitting: Internal Medicine

## 2014-05-24 DIAGNOSIS — Z0000 Encounter for general adult medical examination without abnormal findings: Secondary | ICD-10-CM

## 2014-06-04 ENCOUNTER — Other Ambulatory Visit: Payer: Self-pay

## 2014-06-09 ENCOUNTER — Encounter: Payer: 59 | Admitting: Internal Medicine

## 2014-06-09 ENCOUNTER — Other Ambulatory Visit (INDEPENDENT_AMBULATORY_CARE_PROVIDER_SITE_OTHER): Payer: 59

## 2014-06-09 DIAGNOSIS — Z0000 Encounter for general adult medical examination without abnormal findings: Secondary | ICD-10-CM

## 2014-06-09 LAB — URINALYSIS
BILIRUBIN URINE: NEGATIVE
Hgb urine dipstick: NEGATIVE
Ketones, ur: NEGATIVE
Leukocytes, UA: NEGATIVE
Nitrite: NEGATIVE
Specific Gravity, Urine: 1.025 (ref 1.000–1.030)
TOTAL PROTEIN, URINE-UPE24: NEGATIVE
Urine Glucose: NEGATIVE
Urobilinogen, UA: 0.2 (ref 0.0–1.0)
pH: 5.5 (ref 5.0–8.0)

## 2014-06-09 LAB — LIPID PANEL
CHOL/HDL RATIO: 3
Cholesterol: 212 mg/dL — ABNORMAL HIGH (ref 0–200)
HDL: 60.9 mg/dL (ref >39.00)
LDL Cholesterol: 129 mg/dL — ABNORMAL HIGH (ref 0–99)
NonHDL: 151.1
Triglycerides: 109 mg/dL (ref 0.0–149.0)
VLDL: 21.8 mg/dL (ref 0.0–40.0)

## 2014-06-09 LAB — CBC WITH DIFFERENTIAL/PLATELET
Basophils Absolute: 0.1 10*3/uL (ref 0.0–0.1)
Basophils Relative: 0.9 % (ref 0.0–3.0)
EOS ABS: 0.2 10*3/uL (ref 0.0–0.7)
Eosinophils Relative: 2.4 % (ref 0.0–5.0)
HCT: 47.4 % (ref 39.0–52.0)
Hemoglobin: 15.6 g/dL (ref 13.0–17.0)
LYMPHS ABS: 1.9 10*3/uL (ref 0.7–4.0)
Lymphocytes Relative: 25.1 % (ref 12.0–46.0)
MCHC: 32.9 g/dL (ref 30.0–36.0)
MCV: 94.4 fl (ref 78.0–100.0)
MONO ABS: 0.6 10*3/uL (ref 0.1–1.0)
Monocytes Relative: 7.6 % (ref 3.0–12.0)
NEUTROS ABS: 4.9 10*3/uL (ref 1.4–7.7)
Neutrophils Relative %: 64 % (ref 43.0–77.0)
Platelets: 241 10*3/uL (ref 150.0–400.0)
RBC: 5.02 Mil/uL (ref 4.22–5.81)
RDW: 14.3 % (ref 11.5–15.5)
WBC: 7.6 10*3/uL (ref 4.0–10.5)

## 2014-06-09 LAB — PSA: PSA: 0.59 ng/mL (ref 0.10–4.00)

## 2014-06-09 LAB — HEPATIC FUNCTION PANEL
ALBUMIN: 3.7 g/dL (ref 3.5–5.2)
ALK PHOS: 86 U/L (ref 39–117)
ALT: 18 U/L (ref 0–53)
AST: 21 U/L (ref 0–37)
BILIRUBIN DIRECT: 0.1 mg/dL (ref 0.0–0.3)
Total Bilirubin: 0.9 mg/dL (ref 0.2–1.2)
Total Protein: 7.3 g/dL (ref 6.0–8.3)

## 2014-06-09 LAB — BASIC METABOLIC PANEL
BUN: 11 mg/dL (ref 6–23)
CHLORIDE: 107 meq/L (ref 96–112)
CO2: 26 meq/L (ref 19–32)
CREATININE: 1.2 mg/dL (ref 0.4–1.5)
Calcium: 9.6 mg/dL (ref 8.4–10.5)
GFR: 66.27 mL/min (ref >60.00)
Glucose, Bld: 79 mg/dL (ref 70–99)
POTASSIUM: 4.5 meq/L (ref 3.5–5.1)
Sodium: 142 mEq/L (ref 135–145)

## 2014-06-09 LAB — TSH: TSH: 1.26 u[IU]/mL (ref 0.35–4.50)

## 2014-06-14 ENCOUNTER — Encounter: Payer: Self-pay | Admitting: Internal Medicine

## 2014-06-14 ENCOUNTER — Ambulatory Visit (INDEPENDENT_AMBULATORY_CARE_PROVIDER_SITE_OTHER): Payer: 59 | Admitting: Internal Medicine

## 2014-06-14 VITALS — BP 132/94 | HR 72 | Temp 97.9°F | Resp 16 | Ht 67.5 in | Wt 212.0 lb

## 2014-06-14 DIAGNOSIS — Z0000 Encounter for general adult medical examination without abnormal findings: Secondary | ICD-10-CM

## 2014-06-14 DIAGNOSIS — Z23 Encounter for immunization: Secondary | ICD-10-CM

## 2014-06-14 MED ORDER — ZOLPIDEM TARTRATE 10 MG PO TABS: 10.0000 mg | ORAL_TABLET | Freq: Every evening | ORAL | Status: DC | PRN

## 2014-06-14 MED ORDER — AMLODIPINE BESYLATE 10 MG PO TABS: 10.0000 mg | ORAL_TABLET | Freq: Every day | ORAL | Status: DC

## 2014-06-14 MED ORDER — VALACYCLOVIR HCL 1 G PO TABS: 500.0000 mg | ORAL_TABLET | Freq: Two times a day (BID) | ORAL | Status: DC

## 2014-06-14 MED ORDER — FLUTICASONE PROPIONATE 50 MCG/ACT NA SUSP: 1.0000 | Freq: Every day | NASAL | Status: DC | PRN

## 2014-06-14 MED ORDER — VALSARTAN 320 MG PO TABS: 320.0000 mg | ORAL_TABLET | Freq: Every morning | ORAL | Status: DC

## 2014-06-14 NOTE — Progress Notes (Signed)
Pre visit review using our clinic review tool, if applicable. No additional management support is needed unless otherwise documented below in the visit note. 

## 2014-06-14 NOTE — Progress Notes (Signed)
   Subjective:    HPI  The patient is here for a wellness exam. The patient has been doing well overall without major physical or psychological issues going on lately   BP Readings from Last 3 Encounters:  06/14/14 132/94  09/07/13 122/80  06/08/13 124/90   Wt Readings from Last 3 Encounters:  06/14/14 212 lb (96.163 kg)  09/07/13 216 lb (97.977 kg)  06/08/13 217 lb (98.431 kg)       Review of Systems  Constitutional: Negative for appetite change, fatigue and unexpected weight change.  HENT: Negative for congestion, nosebleeds, sneezing, sore throat and trouble swallowing.   Eyes: Negative for itching and visual disturbance.  Respiratory: Negative for cough.   Cardiovascular: Negative for chest pain, palpitations and leg swelling.  Gastrointestinal: Negative for nausea, diarrhea, blood in stool and abdominal distention.  Genitourinary: Negative for frequency and hematuria.  Musculoskeletal: Negative for back pain, gait problem, joint swelling and neck pain.  Skin: Negative for rash.  Neurological: Negative for dizziness, tremors, speech difficulty and weakness.  Psychiatric/Behavioral: Negative for sleep disturbance, dysphoric mood and agitation. The patient is not nervous/anxious.        Objective:   Physical Exam  Constitutional: He is oriented to person, place, and time. He appears well-developed and well-nourished. No distress.  HENT:  Head: Normocephalic and atraumatic.  Right Ear: External ear normal.  Left Ear: External ear normal.  Nose: Nose normal.  Mouth/Throat: Oropharynx is clear and moist. No oropharyngeal exudate.  Eyes: Conjunctivae and EOM are normal. Pupils are equal, round, and reactive to light. Right eye exhibits no discharge. Left eye exhibits no discharge. No scleral icterus.  Neck: Normal range of motion. Neck supple. No JVD present. No tracheal deviation present. No thyromegaly present.  Cardiovascular: Normal rate, regular rhythm, normal  heart sounds and intact distal pulses.  Exam reveals no gallop and no friction rub.   No murmur heard. Pulmonary/Chest: Effort normal and breath sounds normal. No stridor. No respiratory distress. He has no wheezes. He has no rales. He exhibits no tenderness.  Abdominal: Soft. Bowel sounds are normal. He exhibits no distension and no mass. There is no tenderness. There is no rebound and no guarding.  Genitourinary: Rectum normal, prostate normal and penis normal. Guaiac negative stool. No penile tenderness.  Musculoskeletal: Normal range of motion. He exhibits no edema and no tenderness.  Lymphadenopathy:    He has no cervical adenopathy.  Neurological: He is alert and oriented to person, place, and time. He has normal reflexes. No cranial nerve deficit. He exhibits normal muscle tone. Coordination normal.  Skin: Skin is warm and dry. No rash noted. He is not diaphoretic. No erythema. No pallor.  Psychiatric: He has a normal mood and affect. His behavior is normal. Judgment and thought content normal.  Toes - wnl   Lab Results  Component Value Date   WBC 7.6 06/09/2014   HGB 15.6 06/09/2014   HCT 47.4 06/09/2014   PLT 241.0 06/09/2014   CHOL 212* 06/09/2014   TRIG 109.0 06/09/2014   HDL 60.90 06/09/2014   LDLDIRECT 169.7 06/01/2013   ALT 18 06/09/2014   AST 21 06/09/2014   NA 142 06/09/2014   K 4.5 06/09/2014   CL 107 06/09/2014   CREATININE 1.2 06/09/2014   BUN 11 06/09/2014   CO2 26 06/09/2014   TSH 1.26 06/09/2014   PSA 0.59 06/09/2014   INR 0.97 11/22/2012        Assessment & Plan:

## 2014-06-14 NOTE — Assessment & Plan Note (Signed)
We discussed age appropriate health related issues, including available/recomended screening tests and vaccinations. We discussed a need for adhering to healthy diet and exercise. Labs/EKG were reviewed/ordered. All questions were answered.  DOT filled out

## 2014-06-21 ENCOUNTER — Encounter: Payer: 59 | Admitting: Internal Medicine

## 2014-12-05 ENCOUNTER — Other Ambulatory Visit: Payer: Self-pay | Admitting: Internal Medicine

## 2014-12-08 ENCOUNTER — Other Ambulatory Visit: Payer: Self-pay

## 2014-12-08 ENCOUNTER — Telehealth: Payer: Self-pay | Admitting: Internal Medicine

## 2014-12-08 NOTE — Telephone Encounter (Signed)
Called ambien rx in to patient pharm

## 2014-12-08 NOTE — Telephone Encounter (Signed)
Patient states pharmacy does not have script for Ambien.  Is requesting to resend it.

## 2014-12-15 ENCOUNTER — Encounter: Payer: Self-pay | Admitting: Internal Medicine

## 2014-12-15 ENCOUNTER — Ambulatory Visit (INDEPENDENT_AMBULATORY_CARE_PROVIDER_SITE_OTHER): Payer: 59 | Admitting: Internal Medicine

## 2014-12-15 VITALS — BP 130/78 | HR 63 | Wt 210.0 lb

## 2014-12-15 DIAGNOSIS — I10 Essential (primary) hypertension: Secondary | ICD-10-CM | POA: Diagnosis not present

## 2014-12-15 DIAGNOSIS — R609 Edema, unspecified: Secondary | ICD-10-CM | POA: Diagnosis not present

## 2014-12-15 DIAGNOSIS — Z0000 Encounter for general adult medical examination without abnormal findings: Secondary | ICD-10-CM

## 2014-12-15 MED ORDER — AMLODIPINE BESYLATE 10 MG PO TABS: 10.0000 mg | ORAL_TABLET | Freq: Every day | ORAL | Status: DC

## 2014-12-15 MED ORDER — VALSARTAN 320 MG PO TABS: 320.0000 mg | ORAL_TABLET | Freq: Every morning | ORAL | Status: DC

## 2014-12-15 MED ORDER — FLUTICASONE PROPIONATE 50 MCG/ACT NA SUSP: 1.0000 | Freq: Every day | NASAL | Status: DC | PRN

## 2014-12-15 MED ORDER — VALACYCLOVIR HCL 1 G PO TABS: 500.0000 mg | ORAL_TABLET | Freq: Two times a day (BID) | ORAL | Status: DC

## 2014-12-15 NOTE — Progress Notes (Signed)
   Subjective:    HPI  F/u HTN, herpes Tom is diagnosed w/a cataract   BP Readings from Last 3 Encounters:  12/15/14 130/78  06/14/14 132/94  09/07/13 122/80   Wt Readings from Last 3 Encounters:  12/15/14 210 lb (95.255 kg)  06/14/14 212 lb (96.163 kg)  09/07/13 216 lb (97.977 kg)       Review of Systems  Constitutional: Negative for appetite change, fatigue and unexpected weight change.  HENT: Negative for congestion, nosebleeds, sneezing, sore throat and trouble swallowing.   Eyes: Negative for itching and visual disturbance.  Respiratory: Negative for cough.   Cardiovascular: Negative for chest pain, palpitations and leg swelling.  Gastrointestinal: Negative for nausea, diarrhea, blood in stool and abdominal distention.  Genitourinary: Negative for frequency and hematuria.  Musculoskeletal: Negative for back pain, joint swelling, gait problem and neck pain.  Skin: Negative for rash.  Neurological: Negative for dizziness, tremors, speech difficulty and weakness.  Psychiatric/Behavioral: Negative for sleep disturbance, dysphoric mood and agitation. The patient is not nervous/anxious.        Objective:   Physical Exam  Constitutional: He is oriented to person, place, and time. He appears well-developed and well-nourished. No distress.  HENT:  Head: Normocephalic and atraumatic.  Right Ear: External ear normal.  Left Ear: External ear normal.  Nose: Nose normal.  Mouth/Throat: Oropharynx is clear and moist. No oropharyngeal exudate.  Eyes: Conjunctivae and EOM are normal. Pupils are equal, round, and reactive to light. Right eye exhibits no discharge. Left eye exhibits no discharge. No scleral icterus.  Neck: Normal range of motion. Neck supple. No JVD present. No tracheal deviation present. No thyromegaly present.  Cardiovascular: Normal rate, regular rhythm, normal heart sounds and intact distal pulses.  Exam reveals no gallop and no friction rub.   No murmur  heard. Pulmonary/Chest: Effort normal and breath sounds normal. No stridor. No respiratory distress. He has no wheezes. He has no rales. He exhibits no tenderness.  Abdominal: Soft. Bowel sounds are normal. He exhibits no distension and no mass. There is no tenderness. There is no rebound and no guarding.  Genitourinary: Rectum normal, prostate normal and penis normal. Guaiac negative stool. No penile tenderness.  Musculoskeletal: Normal range of motion. He exhibits no edema or tenderness.  Lymphadenopathy:    He has no cervical adenopathy.  Neurological: He is alert and oriented to person, place, and time. He has normal reflexes. No cranial nerve deficit. He exhibits normal muscle tone. Coordination normal.  Skin: Skin is warm and dry. No rash noted. He is not diaphoretic. No erythema. No pallor.  Psychiatric: He has a normal mood and affect. His behavior is normal. Judgment and thought content normal.  Toes - wnl LEs trace edema  AKs on scalp   Lab Results  Component Value Date   WBC 7.6 06/09/2014   HGB 15.6 06/09/2014   HCT 47.4 06/09/2014   PLT 241.0 06/09/2014   CHOL 212* 06/09/2014   TRIG 109.0 06/09/2014   HDL 60.90 06/09/2014   LDLDIRECT 169.7 06/01/2013   ALT 18 06/09/2014   AST 21 06/09/2014   NA 142 06/09/2014   K 4.5 06/09/2014   CL 107 06/09/2014   CREATININE 1.2 06/09/2014   BUN 11 06/09/2014   CO2 26 06/09/2014   TSH 1.26 06/09/2014   PSA 0.59 06/09/2014   INR 0.97 11/22/2012        Assessment & Plan:

## 2014-12-15 NOTE — Progress Notes (Signed)
Pre visit review using our clinic review tool, if applicable. No additional management support is needed unless otherwise documented below in the visit note. 

## 2014-12-15 NOTE — Assessment & Plan Note (Signed)
Norvasc Valsartan

## 2014-12-15 NOTE — Assessment & Plan Note (Signed)
Reduce Norvasc if needed

## 2015-05-19 ENCOUNTER — Other Ambulatory Visit: Payer: Self-pay | Admitting: Internal Medicine

## 2015-05-23 NOTE — Telephone Encounter (Signed)
paltient is calling to follow up on rx request. Advised that we do have it and to allow more time.

## 2015-05-25 ENCOUNTER — Other Ambulatory Visit: Payer: Self-pay | Admitting: Internal Medicine

## 2015-06-06 ENCOUNTER — Other Ambulatory Visit (INDEPENDENT_AMBULATORY_CARE_PROVIDER_SITE_OTHER): Payer: 59

## 2015-06-06 DIAGNOSIS — Z0000 Encounter for general adult medical examination without abnormal findings: Secondary | ICD-10-CM | POA: Diagnosis not present

## 2015-06-06 LAB — CBC WITH DIFFERENTIAL/PLATELET
BASOS PCT: 0.9 % (ref 0.0–3.0)
Basophils Absolute: 0.1 10*3/uL (ref 0.0–0.1)
EOS ABS: 0.2 10*3/uL (ref 0.0–0.7)
EOS PCT: 2.8 % (ref 0.0–5.0)
HCT: 48.9 % (ref 39.0–52.0)
HEMOGLOBIN: 15.9 g/dL (ref 13.0–17.0)
Lymphocytes Relative: 28.3 % (ref 12.0–46.0)
Lymphs Abs: 1.9 10*3/uL (ref 0.7–4.0)
MCHC: 32.5 g/dL (ref 30.0–36.0)
MCV: 95.3 fl (ref 78.0–100.0)
Monocytes Absolute: 0.5 10*3/uL (ref 0.1–1.0)
Monocytes Relative: 7.3 % (ref 3.0–12.0)
Neutro Abs: 4.2 10*3/uL (ref 1.4–7.7)
Neutrophils Relative %: 60.7 % (ref 43.0–77.0)
Platelets: 225 10*3/uL (ref 150.0–400.0)
RBC: 5.13 Mil/uL (ref 4.22–5.81)
RDW: 14.2 % (ref 11.5–15.5)
WBC: 6.9 10*3/uL (ref 4.0–10.5)

## 2015-06-06 LAB — BASIC METABOLIC PANEL
BUN: 11 mg/dL (ref 6–23)
CO2: 27 mEq/L (ref 19–32)
Calcium: 10.2 mg/dL (ref 8.4–10.5)
Chloride: 105 mEq/L (ref 96–112)
Creatinine, Ser: 1.13 mg/dL (ref 0.40–1.50)
GFR: 69.44 mL/min (ref >60.00)
GLUCOSE: 98 mg/dL (ref 70–99)
Potassium: 4.5 mEq/L (ref 3.5–5.1)
Sodium: 143 mEq/L (ref 135–145)

## 2015-06-06 LAB — LIPID PANEL
CHOL/HDL RATIO: 3
Cholesterol: 205 mg/dL — ABNORMAL HIGH (ref 0–200)
HDL: 61.7 mg/dL (ref >39.00)
LDL Cholesterol: 120 mg/dL — ABNORMAL HIGH (ref 0–99)
NonHDL: 143.01
TRIGLYCERIDES: 116 mg/dL (ref 0.0–149.0)
VLDL: 23.2 mg/dL (ref 0.0–40.0)

## 2015-06-06 LAB — URINALYSIS
Bilirubin Urine: NEGATIVE
Hgb urine dipstick: NEGATIVE
Ketones, ur: NEGATIVE
LEUKOCYTES UA: NEGATIVE
Nitrite: NEGATIVE
Total Protein, Urine: NEGATIVE
UROBILINOGEN UA: 0.2 (ref 0.0–1.0)
Urine Glucose: NEGATIVE
pH: 5.5 (ref 5.0–8.0)

## 2015-06-06 LAB — HEPATIC FUNCTION PANEL
ALT: 16 U/L (ref 0–53)
AST: 19 U/L (ref 0–37)
Albumin: 4.1 g/dL (ref 3.5–5.2)
Alkaline Phosphatase: 83 U/L (ref 39–117)
BILIRUBIN DIRECT: 0.1 mg/dL (ref 0.0–0.3)
TOTAL PROTEIN: 7.1 g/dL (ref 6.0–8.3)
Total Bilirubin: 0.7 mg/dL (ref 0.2–1.2)

## 2015-06-06 LAB — TSH: TSH: 1.14 u[IU]/mL (ref 0.35–4.50)

## 2015-06-06 LAB — PSA: PSA: 0.61 ng/mL (ref 0.10–4.00)

## 2015-06-16 ENCOUNTER — Ambulatory Visit (INDEPENDENT_AMBULATORY_CARE_PROVIDER_SITE_OTHER): Payer: 59 | Admitting: Internal Medicine

## 2015-06-16 ENCOUNTER — Encounter: Payer: Self-pay | Admitting: Internal Medicine

## 2015-06-16 VITALS — BP 110/70 | HR 80 | Wt 213.0 lb

## 2015-06-16 DIAGNOSIS — G4700 Insomnia, unspecified: Secondary | ICD-10-CM | POA: Insufficient documentation

## 2015-06-16 DIAGNOSIS — M5440 Lumbago with sciatica, unspecified side: Secondary | ICD-10-CM

## 2015-06-16 DIAGNOSIS — Z23 Encounter for immunization: Secondary | ICD-10-CM | POA: Diagnosis not present

## 2015-06-16 DIAGNOSIS — I10 Essential (primary) hypertension: Secondary | ICD-10-CM | POA: Diagnosis not present

## 2015-06-16 DIAGNOSIS — E785 Hyperlipidemia, unspecified: Secondary | ICD-10-CM | POA: Diagnosis not present

## 2015-06-16 MED ORDER — VALACYCLOVIR HCL 1 G PO TABS: 500.0000 mg | ORAL_TABLET | Freq: Two times a day (BID) | ORAL | Status: DC

## 2015-06-16 MED ORDER — FLUTICASONE PROPIONATE 50 MCG/ACT NA SUSP: 1.0000 | Freq: Every day | NASAL | Status: DC | PRN

## 2015-06-16 MED ORDER — VALSARTAN 320 MG PO TABS: 320.0000 mg | ORAL_TABLET | Freq: Every morning | ORAL | Status: DC

## 2015-06-16 MED ORDER — AMLODIPINE BESYLATE 10 MG PO TABS: 10.0000 mg | ORAL_TABLET | Freq: Every day | ORAL | Status: DC

## 2015-06-16 MED ORDER — ZOLPIDEM TARTRATE 10 MG PO TABS: ORAL_TABLET | ORAL | Status: DC

## 2015-06-16 NOTE — Progress Notes (Signed)
Pre visit review using our clinic review tool, if applicable. No additional management support is needed unless otherwise documented below in the visit note. 

## 2015-06-16 NOTE — Assessment & Plan Note (Signed)
On Zolpidem  Potential benefits of a long term benzodiazepines  use as well as potential risks  and complications were explained to the patient and were aknowledged. 

## 2015-06-16 NOTE — Assessment & Plan Note (Signed)
Doing well 

## 2015-06-16 NOTE — Progress Notes (Signed)
Subjective:  Patient ID: Raymond Snow, male    DOB: [birth year]  Age: 64 y.o. MRN: [id number]  CC: No chief complaint on file.   HPI Raymond Snow presents for HTN, OA, insomnia f/u  Outpatient Prescriptions Prior to Visit  Medication Sig Dispense Refill  . aspirin 81 MG chewable tablet Chew 81 mg by mouth daily.      Marland Kitchen amLODipine (NORVASC) 10 MG tablet Take 1 tablet (10 mg total) by mouth daily. 90 tablet 3  . fluticasone (FLONASE) 50 MCG/ACT nasal spray Place 1-2 sprays into both nostrils daily as needed for rhinitis. 48 g 3  . valACYclovir (VALTREX) 1000 MG tablet Take 0.5 tablets (500 mg total) by mouth 2 (two) times daily. 180 tablet 3  . valsartan (DIOVAN) 320 MG tablet Take 1 tablet (320 mg total) by mouth every morning. 90 tablet 3  . zolpidem (AMBIEN) 10 MG tablet TAKE 1 TABLET AT BEDTIME IF NEEDED FOR SLEEP. 90 tablet 1   No facility-administered medications prior to visit.    ROS Review of Systems  Constitutional: Negative for appetite change, fatigue and unexpected weight change.  HENT: Negative for congestion, nosebleeds, sneezing, sore throat and trouble swallowing.   Eyes: Negative for itching and visual disturbance.  Respiratory: Negative for cough.   Cardiovascular: Negative for chest pain, palpitations and leg swelling.  Gastrointestinal: Negative for nausea, diarrhea, blood in stool and abdominal distention.  Genitourinary: Negative for frequency and hematuria.  Musculoskeletal: Positive for arthralgias. Negative for back pain, joint swelling, gait problem and neck pain.  Skin: Negative for rash.  Neurological: Negative for dizziness, tremors, speech difficulty and weakness.  Psychiatric/Behavioral: Negative for suicidal ideas, sleep disturbance, dysphoric mood and agitation. The patient is not nervous/anxious.     Objective:  BP 110/70 mmHg  Pulse 80  Wt 213 lb (96.616 kg)  SpO2 96%  BP Readings from Last 3 Encounters:  06/16/15 110/70  12/15/14  130/78  06/14/14 132/94    Wt Readings from Last 3 Encounters:  06/16/15 213 lb (96.616 kg)  12/15/14 210 lb (95.255 kg)  06/14/14 212 lb (96.163 kg)    Physical Exam  Constitutional: He is oriented to person, place, and time. He appears well-developed. No distress.  NAD  HENT:  Mouth/Throat: Oropharynx is clear and moist.  Eyes: Conjunctivae are normal. Pupils are equal, round, and reactive to light.  Neck: Normal range of motion. No JVD present. No thyromegaly present.  Cardiovascular: Normal rate, regular rhythm, normal heart sounds and intact distal pulses.  Exam reveals no gallop and no friction rub.   No murmur heard. Pulmonary/Chest: Effort normal and breath sounds normal. No respiratory distress. He has no wheezes. He has no rales. He exhibits no tenderness.  Abdominal: Soft. Bowel sounds are normal. He exhibits no distension and no mass. There is no tenderness. There is no rebound and no guarding.  Musculoskeletal: Normal range of motion. He exhibits no edema or tenderness.  Lymphadenopathy:    He has no cervical adenopathy.  Neurological: He is alert and oriented to person, place, and time. He has normal reflexes. No cranial nerve deficit. He exhibits normal muscle tone. He displays a negative Romberg sign. Coordination and gait normal.  Skin: Skin is warm and dry. No rash noted.  Psychiatric: He has a normal mood and affect. His behavior is normal. Judgment and thought content normal.    Lab Results  Component Value Date   WBC 6.9 06/06/2015   HGB 15.9 06/06/2015  HCT 48.9 06/06/2015   PLT 225.0 06/06/2015   GLUCOSE 98 06/06/2015   CHOL 205* 06/06/2015   TRIG 116.0 06/06/2015   HDL 61.70 06/06/2015   LDLDIRECT 169.7 06/01/2013   LDLCALC 120* 06/06/2015   ALT 16 06/06/2015   AST 19 06/06/2015   NA 143 06/06/2015   K 4.5 06/06/2015   CL 105 06/06/2015   CREATININE 1.13 06/06/2015   BUN 11 06/06/2015   CO2 27 06/06/2015   TSH 1.14 06/06/2015   PSA 0.61  06/06/2015   INR 0.97 11/22/2012    Dg Wrist 2 Views Left  12/01/2012  *RADIOLOGY REPORT* Clinical Data: Wrist pain. LEFT WRIST - 2 VIEW,DG C-ARM 61-120 MIN Comparison: 11/22/2012. Findings: Open reduction internal fixation comminuted distal radius fracture.  Improved position and alignment. IMPRESSION: As above. Original Report Authenticated By: Rolla Flatten, M.D.   Dg C-arm [phone] Min  12/01/2012  *RADIOLOGY REPORT* Clinical Data: Wrist pain. LEFT WRIST - 2 VIEW,DG C-ARM 61-120 MIN Comparison: 11/22/2012. Findings: Open reduction internal fixation comminuted distal radius fracture.  Improved position and alignment. IMPRESSION: As above. Original Report Authenticated By: Rolla Flatten, M.D.    Assessment & Plan:   Diagnoses and all orders for this visit:  Essential hypertension  Dyslipidemia  Low back pain with sciatica, sciatica laterality unspecified, unspecified back pain laterality  Insomnia  Need for influenza vaccination -     Flu Vaccine QUAD 36+ mos IM  Other orders -     amLODipine (NORVASC) 10 MG tablet; Take 1 tablet (10 mg total) by mouth daily. -     fluticasone (FLONASE) 50 MCG/ACT nasal spray; Place 1-2 sprays into both nostrils daily as needed for rhinitis. -     valACYclovir (VALTREX) 1000 MG tablet; Take 0.5 tablets (500 mg total) by mouth 2 (two) times daily. -     valsartan (DIOVAN) 320 MG tablet; Take 1 tablet (320 mg total) by mouth every morning. -     zolpidem (AMBIEN) 10 MG tablet; TAKE 1 TABLET AT BEDTIME IF NEEDED FOR SLEEP.   I am having [name] maintain his aspirin, amLODipine, fluticasone, valACYclovir, valsartan, and zolpidem.  Meds ordered this encounter  Medications  . amLODipine (NORVASC) 10 MG tablet    Sig: Take 1 tablet (10 mg total) by mouth daily.    Dispense:  90 tablet    Refill:  3  . fluticasone (FLONASE) 50 MCG/ACT nasal spray    Sig: Place 1-2 sprays into both nostrils daily as needed for rhinitis.    Dispense:  48 g    Refill:   3  . valACYclovir (VALTREX) 1000 MG tablet    Sig: Take 0.5 tablets (500 mg total) by mouth 2 (two) times daily.    Dispense:  180 tablet    Refill:  3  . valsartan (DIOVAN) 320 MG tablet    Sig: Take 1 tablet (320 mg total) by mouth every morning.    Dispense:  90 tablet    Refill:  3  . zolpidem (AMBIEN) 10 MG tablet    Sig: TAKE 1 TABLET AT BEDTIME IF NEEDED FOR SLEEP.    Dispense:  90 tablet    Refill:  1     Follow-up: Return in about 6 months (around 12/15/2015) for Wellness Exam.  Walker Kehr, MD

## 2015-06-16 NOTE — Assessment & Plan Note (Signed)
Norvasc Valsartan

## 2015-06-16 NOTE — Assessment & Plan Note (Signed)
Not on statins 

## 2015-08-01 ENCOUNTER — Other Ambulatory Visit: Payer: Self-pay | Admitting: Internal Medicine

## 2015-12-15 ENCOUNTER — Ambulatory Visit (INDEPENDENT_AMBULATORY_CARE_PROVIDER_SITE_OTHER): Payer: 59 | Admitting: Internal Medicine

## 2015-12-15 ENCOUNTER — Other Ambulatory Visit (INDEPENDENT_AMBULATORY_CARE_PROVIDER_SITE_OTHER): Payer: 59

## 2015-12-15 ENCOUNTER — Encounter: Payer: Self-pay | Admitting: Internal Medicine

## 2015-12-15 VITALS — BP 120/60 | HR 80 | Ht 68.0 in | Wt 210.0 lb

## 2015-12-15 DIAGNOSIS — L570 Actinic keratosis: Secondary | ICD-10-CM | POA: Diagnosis not present

## 2015-12-15 DIAGNOSIS — Z0000 Encounter for general adult medical examination without abnormal findings: Secondary | ICD-10-CM | POA: Diagnosis not present

## 2015-12-15 LAB — CBC WITH DIFFERENTIAL/PLATELET
BASOS ABS: 0.1 10*3/uL (ref 0.0–0.1)
BASOS PCT: 1.5 % (ref 0.0–3.0)
EOS ABS: 0.2 10*3/uL (ref 0.0–0.7)
Eosinophils Relative: 2.7 % (ref 0.0–5.0)
HEMATOCRIT: 48 % (ref 39.0–52.0)
HEMOGLOBIN: 16.3 g/dL (ref 13.0–17.0)
LYMPHS PCT: 26.6 % (ref 12.0–46.0)
Lymphs Abs: 2 10*3/uL (ref 0.7–4.0)
MCHC: 34 g/dL (ref 30.0–36.0)
MCV: 92.5 fl (ref 78.0–100.0)
MONO ABS: 0.7 10*3/uL (ref 0.1–1.0)
Monocytes Relative: 8.9 % (ref 3.0–12.0)
Neutro Abs: 4.5 10*3/uL (ref 1.4–7.7)
Neutrophils Relative %: 60.3 % (ref 43.0–77.0)
Platelets: 228 10*3/uL (ref 150.0–400.0)
RBC: 5.19 Mil/uL (ref 4.22–5.81)
RDW: 14.9 % (ref 11.5–15.5)
WBC: 7.5 10*3/uL (ref 4.0–10.5)

## 2015-12-15 LAB — BASIC METABOLIC PANEL
BUN: 14 mg/dL (ref 6–23)
CHLORIDE: 104 meq/L (ref 96–112)
CO2: 29 mEq/L (ref 19–32)
CREATININE: 1.08 mg/dL (ref 0.40–1.50)
Calcium: 10.1 mg/dL (ref 8.4–10.5)
GFR: 73.05 mL/min (ref >60.00)
Glucose, Bld: 93 mg/dL (ref 70–99)
Potassium: 5 mEq/L (ref 3.5–5.1)
Sodium: 139 mEq/L (ref 135–145)

## 2015-12-15 LAB — URINALYSIS
BILIRUBIN URINE: NEGATIVE
Hgb urine dipstick: NEGATIVE
KETONES UR: NEGATIVE
LEUKOCYTES UA: NEGATIVE
Nitrite: NEGATIVE
PH: 5.5 (ref 5.0–8.0)
SPECIFIC GRAVITY, URINE: 1.025 (ref 1.000–1.030)
TOTAL PROTEIN, URINE-UPE24: NEGATIVE
Urine Glucose: NEGATIVE
Urobilinogen, UA: 0.2 (ref 0.0–1.0)

## 2015-12-15 LAB — LIPID PANEL
CHOL/HDL RATIO: 4
CHOLESTEROL: 235 mg/dL — AB (ref 0–200)
HDL: 66.3 mg/dL (ref >39.00)
LDL CALC: 149 mg/dL — AB (ref 0–99)
NONHDL: 168.31
Triglycerides: 99 mg/dL (ref 0.0–149.0)
VLDL: 19.8 mg/dL (ref 0.0–40.0)

## 2015-12-15 LAB — HEPATIC FUNCTION PANEL
ALK PHOS: 81 U/L (ref 39–117)
ALT: 22 U/L (ref 0–53)
AST: 21 U/L (ref 0–37)
Albumin: 4.6 g/dL (ref 3.5–5.2)
BILIRUBIN DIRECT: 0.2 mg/dL (ref 0.0–0.3)
BILIRUBIN TOTAL: 1 mg/dL (ref 0.2–1.2)
TOTAL PROTEIN: 7.5 g/dL (ref 6.0–8.3)

## 2015-12-15 LAB — PSA: PSA: 0.38 ng/mL (ref 0.10–4.00)

## 2015-12-15 LAB — HEPATITIS C ANTIBODY: HCV Ab: NEGATIVE

## 2015-12-15 LAB — TSH: TSH: 1.05 u[IU]/mL (ref 0.35–4.50)

## 2015-12-15 MED ORDER — VALACYCLOVIR HCL 1 G PO TABS: 500.0000 mg | ORAL_TABLET | Freq: Two times a day (BID) | ORAL | Status: DC

## 2015-12-15 MED ORDER — AMLODIPINE BESYLATE 10 MG PO TABS: 10.0000 mg | ORAL_TABLET | Freq: Every day | ORAL | Status: DC

## 2015-12-15 MED ORDER — VALSARTAN 320 MG PO TABS: 320.0000 mg | ORAL_TABLET | Freq: Every day | ORAL | Status: DC

## 2015-12-15 MED ORDER — ZOLPIDEM TARTRATE 10 MG PO TABS: ORAL_TABLET | ORAL | Status: DC

## 2015-12-15 NOTE — Assessment & Plan Note (Signed)
We discussed age appropriate health related issues, including available/recomended screening tests and vaccinations. We discussed a need for adhering to healthy diet and exercise. Labs/EKG were reviewed/ordered. All questions were answered.   

## 2015-12-15 NOTE — Progress Notes (Signed)
Subjective:  Patient ID: Raymond Snow, male    DOB: 16-Feb-1951  Age: 65 y.o. MRN: [id number]  CC: Annual Exam   HPI Raymond Snow presents for a well exam C/o R shoulder pain C/o L temple has a skin lesion  Outpatient Prescriptions Prior to Visit  Medication Sig Dispense Refill  . amLODipine (NORVASC) 10 MG tablet Take 1 tablet (10 mg total) by mouth daily. 90 tablet 3  . aspirin 81 MG chewable tablet Chew 81 mg by mouth daily.      . fluticasone (FLONASE) 50 MCG/ACT nasal spray Place 1-2 sprays into both nostrils daily as needed for rhinitis. 48 g 3  . valACYclovir (VALTREX) 1000 MG tablet Take 0.5 tablets (500 mg total) by mouth 2 (two) times daily. 180 tablet 3  . valsartan (DIOVAN) 320 MG tablet TAKE (1) TABLET DAILY IN THE MORNING. 90 tablet 0  . zolpidem (AMBIEN) 10 MG tablet TAKE 1 TABLET AT BEDTIME IF NEEDED FOR SLEEP. 90 tablet 1   No facility-administered medications prior to visit.    ROS Review of Systems  Constitutional: Negative for appetite change, fatigue and unexpected weight change.  HENT: Negative for congestion, nosebleeds, sneezing, sore throat and trouble swallowing.   Eyes: Negative for itching and visual disturbance.  Respiratory: Negative for cough.   Cardiovascular: Negative for chest pain, palpitations and leg swelling.  Gastrointestinal: Negative for nausea, diarrhea, blood in stool and abdominal distention.  Genitourinary: Negative for frequency and hematuria.  Musculoskeletal: Positive for arthralgias. Negative for back pain, joint swelling, gait problem and neck pain.  Skin: Negative for rash.  Neurological: Negative for dizziness, tremors, speech difficulty and weakness.  Psychiatric/Behavioral: Negative for sleep disturbance, dysphoric mood, decreased concentration and agitation. The patient is not nervous/anxious.     Objective:  BP 120/60 mmHg  Pulse 80  Ht 5\' 8"  (1.727 m)  Wt 210 lb (95.255 kg)  BMI 31.94 kg/m2  SpO2 94%  BP  Readings from Last 3 Encounters:  12/15/15 120/60  06/16/15 110/70  12/15/14 130/78    Wt Readings from Last 3 Encounters:  12/15/15 210 lb (95.255 kg)  06/16/15 213 lb (96.616 kg)  12/15/14 210 lb (95.255 kg)    Physical Exam  Constitutional: He is oriented to person, place, and time. He appears well-developed. No distress.  NAD  HENT:  Mouth/Throat: Oropharynx is clear and moist.  Eyes: Conjunctivae are normal. Pupils are equal, round, and reactive to light.  Neck: Normal range of motion. No JVD present. No thyromegaly present.  Cardiovascular: Normal rate, regular rhythm, normal heart sounds and intact distal pulses.  Exam reveals no gallop and no friction rub.   No murmur heard. Pulmonary/Chest: Effort normal and breath sounds normal. No respiratory distress. He has no wheezes. He has no rales. He exhibits no tenderness.  Abdominal: Soft. Bowel sounds are normal. He exhibits no distension and no mass. There is no tenderness. There is no rebound and no guarding.  Genitourinary: Rectum normal and prostate normal. Guaiac negative stool.  Musculoskeletal: Normal range of motion. He exhibits no edema or tenderness.  Lymphadenopathy:    He has no cervical adenopathy.  Neurological: He is alert and oriented to person, place, and time. He has normal reflexes. No cranial nerve deficit. He exhibits normal muscle tone. He displays a negative Romberg sign. Coordination and gait normal.  Skin: Skin is warm and dry. No rash noted.  Psychiatric: He has a normal mood and affect. His behavior is normal. Judgment and  thought content normal.  AKs on scalp   Procedure Note :     Procedure : Cryosurgery   Indication: Actinic keratosis(es) x6 on scalp  Risks including unsuccessful procedure , bleeding, infection, bruising, scar, a need for a repeat  procedure and others were explained to the patient in detail as well as the benefits. Informed consent was obtained verbally.    6 lesion(s)  on  scalp   was/were treated with liquid nitrogen on a Q-tip in a usual fasion . Band-Aid was applied and antibiotic ointment was given for a later use.   Tolerated well. Complications none.   Postprocedure instructions :     Keep the wounds clean. You can wash them with liquid soap and water. Pat dry with gauze or a Kleenex tissue  Before applying antibiotic ointment and a Band-Aid.   You need to report immediately  if  any signs of infection develop.     Lab Results  Component Value Date   WBC 6.9 06/06/2015   HGB 15.9 06/06/2015   HCT 48.9 06/06/2015   PLT 225.0 06/06/2015   GLUCOSE 98 06/06/2015   CHOL 205* 06/06/2015   TRIG 116.0 06/06/2015   HDL 61.70 06/06/2015   LDLDIRECT 169.7 06/01/2013   LDLCALC 120* 06/06/2015   ALT 16 06/06/2015   AST 19 06/06/2015   NA 143 06/06/2015   K 4.5 06/06/2015   CL 105 06/06/2015   CREATININE 1.13 06/06/2015   BUN 11 06/06/2015   CO2 27 06/06/2015   TSH 1.14 06/06/2015   PSA 0.61 06/06/2015   INR 0.97 11/22/2012    Dg Wrist 2 Views Left  12/01/2012  *RADIOLOGY REPORT* Clinical Data: Wrist pain. LEFT WRIST - 2 VIEW,DG C-ARM 61-120 MIN Comparison: 11/22/2012. Findings: Open reduction internal fixation comminuted distal radius fracture.  Improved position and alignment. IMPRESSION: As above. Original Report Authenticated By: Rolla Flatten, M.D.   Dg C-arm [phone] Min  12/01/2012  *RADIOLOGY REPORT* Clinical Data: Wrist pain. LEFT WRIST - 2 VIEW,DG C-ARM 61-120 MIN Comparison: 11/22/2012. Findings: Open reduction internal fixation comminuted distal radius fracture.  Improved position and alignment. IMPRESSION: As above. Original Report Authenticated By: Rolla Flatten, M.D.    Assessment & Plan:   There are no diagnoses linked to this encounter. I am having Mr. Stillman maintain his aspirin, amLODipine, fluticasone, valACYclovir, zolpidem, and valsartan.  No orders of the defined types were placed in this encounter.     Follow-up: No  Follow-up on file.  Walker Kehr, MD

## 2015-12-15 NOTE — Progress Notes (Signed)
Pre visit review using our clinic review tool, if applicable. No additional management support is needed unless otherwise documented below in the visit note. 

## 2015-12-15 NOTE — Assessment & Plan Note (Signed)
See cryo 

## 2016-05-24 ENCOUNTER — Ambulatory Visit: Payer: 59

## 2016-05-25 ENCOUNTER — Ambulatory Visit (INDEPENDENT_AMBULATORY_CARE_PROVIDER_SITE_OTHER): Payer: 59

## 2016-05-25 DIAGNOSIS — Z23 Encounter for immunization: Secondary | ICD-10-CM | POA: Diagnosis not present

## 2016-07-22 ENCOUNTER — Other Ambulatory Visit: Payer: Self-pay | Admitting: Internal Medicine

## 2016-08-15 ENCOUNTER — Other Ambulatory Visit: Payer: Self-pay | Admitting: Internal Medicine

## 2016-08-15 NOTE — Telephone Encounter (Signed)
Routing to dr crawford in the absence of dr plotnikov--please advise, thanks 

## 2016-08-16 NOTE — Telephone Encounter (Signed)
Faxed to [facility] 

## 2016-09-18 ENCOUNTER — Other Ambulatory Visit: Payer: Self-pay | Admitting: Internal Medicine

## 2016-09-21 ENCOUNTER — Other Ambulatory Visit: Payer: Self-pay | Admitting: Internal Medicine

## 2016-09-21 NOTE — Telephone Encounter (Signed)
Pt call checking status on refilling his ambies. pls advise...Raymond Snow

## 2016-09-23 ENCOUNTER — Encounter: Payer: Self-pay | Admitting: Internal Medicine

## 2016-09-24 NOTE — Telephone Encounter (Signed)
[place] spoke w/[facility] verified if they received script fom 1/30 on pt zolpidem. Per Abigail Butts they did not receive. Gave MD authorization from 1/30 ok #30 w/5 refills...Raymond Snow

## 2016-09-24 NOTE — Telephone Encounter (Signed)
It was done 1/30 Thx

## 2016-12-17 ENCOUNTER — Encounter: Payer: Self-pay | Admitting: Internal Medicine

## 2016-12-17 ENCOUNTER — Ambulatory Visit (INDEPENDENT_AMBULATORY_CARE_PROVIDER_SITE_OTHER): Payer: 59 | Admitting: Internal Medicine

## 2016-12-17 ENCOUNTER — Other Ambulatory Visit (INDEPENDENT_AMBULATORY_CARE_PROVIDER_SITE_OTHER): Payer: 59

## 2016-12-17 VITALS — BP 124/78 | HR 80 | Temp 97.9°F | Ht 68.0 in | Wt 211.0 lb

## 2016-12-17 DIAGNOSIS — Z23 Encounter for immunization: Secondary | ICD-10-CM

## 2016-12-17 DIAGNOSIS — L570 Actinic keratosis: Secondary | ICD-10-CM

## 2016-12-17 DIAGNOSIS — Z0000 Encounter for general adult medical examination without abnormal findings: Secondary | ICD-10-CM | POA: Diagnosis not present

## 2016-12-17 DIAGNOSIS — F5101 Primary insomnia: Secondary | ICD-10-CM

## 2016-12-17 DIAGNOSIS — Z0001 Encounter for general adult medical examination with abnormal findings: Secondary | ICD-10-CM

## 2016-12-17 DIAGNOSIS — D485 Neoplasm of uncertain behavior of skin: Secondary | ICD-10-CM

## 2016-12-17 DIAGNOSIS — I10 Essential (primary) hypertension: Secondary | ICD-10-CM

## 2016-12-17 LAB — CBC WITH DIFFERENTIAL/PLATELET
BASOS PCT: 1.2 % (ref 0.0–3.0)
Basophils Absolute: 0.1 10*3/uL (ref 0.0–0.1)
EOS PCT: 2.2 % (ref 0.0–5.0)
Eosinophils Absolute: 0.2 10*3/uL (ref 0.0–0.7)
HEMATOCRIT: 49.5 % (ref 39.0–52.0)
HEMOGLOBIN: 16.8 g/dL (ref 13.0–17.0)
LYMPHS PCT: 23.9 % (ref 12.0–46.0)
Lymphs Abs: 1.8 10*3/uL (ref 0.7–4.0)
MCHC: 34 g/dL (ref 30.0–36.0)
MCV: 94.1 fl (ref 78.0–100.0)
MONOS PCT: 8.7 % (ref 3.0–12.0)
Monocytes Absolute: 0.6 10*3/uL (ref 0.1–1.0)
Neutro Abs: 4.7 10*3/uL (ref 1.4–7.7)
Neutrophils Relative %: 64 % (ref 43.0–77.0)
Platelets: 235 10*3/uL (ref 150.0–400.0)
RBC: 5.26 Mil/uL (ref 4.22–5.81)
RDW: 14.3 % (ref 11.5–15.5)
WBC: 7.3 10*3/uL (ref 4.0–10.5)

## 2016-12-17 LAB — BASIC METABOLIC PANEL
BUN: 16 mg/dL (ref 6–23)
CHLORIDE: 105 meq/L (ref 96–112)
CO2: 25 mEq/L (ref 19–32)
Calcium: 9.9 mg/dL (ref 8.4–10.5)
Creatinine, Ser: 1.17 mg/dL (ref 0.40–1.50)
GFR: 66.39 mL/min (ref >60.00)
Glucose, Bld: 97 mg/dL (ref 70–99)
POTASSIUM: 4.3 meq/L (ref 3.5–5.1)
Sodium: 139 mEq/L (ref 135–145)

## 2016-12-17 LAB — PSA: PSA: 0.37 ng/mL (ref 0.10–4.00)

## 2016-12-17 LAB — HEPATIC FUNCTION PANEL
ALT: 18 U/L (ref 0–53)
AST: 21 U/L (ref 0–37)
Albumin: 4.3 g/dL (ref 3.5–5.2)
Alkaline Phosphatase: 79 U/L (ref 39–117)
Bilirubin, Direct: 0.2 mg/dL (ref 0.0–0.3)
TOTAL PROTEIN: 7.2 g/dL (ref 6.0–8.3)
Total Bilirubin: 0.9 mg/dL (ref 0.2–1.2)

## 2016-12-17 LAB — URINALYSIS
BILIRUBIN URINE: NEGATIVE
KETONES UR: NEGATIVE
Leukocytes, UA: NEGATIVE
NITRITE: NEGATIVE
Specific Gravity, Urine: >=1.03 — AB (ref 1.000–1.030)
TOTAL PROTEIN, URINE-UPE24: NEGATIVE
Urine Glucose: NEGATIVE
Urobilinogen, UA: 0.2 (ref 0.0–1.0)
pH: 5.5 (ref 5.0–8.0)

## 2016-12-17 LAB — TSH: TSH: 1.18 u[IU]/mL (ref 0.35–4.50)

## 2016-12-17 LAB — LIPID PANEL
CHOL/HDL RATIO: 3
CHOLESTEROL: 216 mg/dL — AB (ref 0–200)
HDL: 68.7 mg/dL (ref >39.00)
LDL Cholesterol: 130 mg/dL — ABNORMAL HIGH (ref 0–99)
NonHDL: 146.88
TRIGLYCERIDES: 83 mg/dL (ref 0.0–149.0)
VLDL: 16.6 mg/dL (ref 0.0–40.0)

## 2016-12-17 MED ORDER — ZOLPIDEM TARTRATE 10 MG PO TABS: 10.0000 mg | ORAL_TABLET | Freq: Every evening | ORAL | 3 refills | Status: DC | PRN

## 2016-12-17 MED ORDER — AMLODIPINE BESYLATE 10 MG PO TABS: 10.0000 mg | ORAL_TABLET | Freq: Every day | ORAL | 3 refills | Status: DC

## 2016-12-17 MED ORDER — TRIAMCINOLONE ACETONIDE 0.5 % EX CREA: 1.0000 "application " | TOPICAL_CREAM | Freq: Three times a day (TID) | CUTANEOUS | 1 refills | Status: AC

## 2016-12-17 MED ORDER — VALACYCLOVIR HCL 1 G PO TABS: 500.0000 mg | ORAL_TABLET | Freq: Two times a day (BID) | ORAL | 3 refills | Status: DC

## 2016-12-17 NOTE — Progress Notes (Signed)
Subjective:  Patient ID: Raymond Snow, male    DOB: [birth year]  Age: 66 y.o. MRN: [id number]  CC: No chief complaint on file.   HPI JOHANNES EVERAGE presents for well exam C/o skin lesions  Outpatient Medications Prior to Visit  Medication Sig Dispense Refill  . amLODipine (NORVASC) 10 MG tablet Take 1 tablet (10 mg total) by mouth daily. 90 tablet 3  . aspirin 81 MG chewable tablet Chew 81 mg by mouth daily.      . fluticasone (FLONASE) 50 MCG/ACT nasal spray Place 1-2 sprays into both nostrils daily as needed for rhinitis. 48 g 3  . valACYclovir (VALTREX) 1000 MG tablet Take 0.5 tablets (500 mg total) by mouth 2 (two) times daily. 180 tablet 3  . valsartan (DIOVAN) 320 MG tablet TAKE (1) TABLET DAILY IN THE MORNING. 90 tablet 3  . zolpidem (AMBIEN) 10 MG tablet TAKE 1 TABLET AT BEDTIME IF NEEDED FOR SLEEP. 30 tablet 5   No facility-administered medications prior to visit.     ROS Review of Systems  Constitutional: Negative for appetite change, fatigue and unexpected weight change.  HENT: Negative for congestion, nosebleeds, sneezing, sore throat and trouble swallowing.   Eyes: Negative for itching and visual disturbance.  Respiratory: Negative for cough.   Cardiovascular: Negative for chest pain, palpitations and leg swelling.  Gastrointestinal: Negative for abdominal distention, blood in stool, diarrhea and nausea.  Genitourinary: Negative for frequency and hematuria.  Musculoskeletal: Negative for back pain, gait problem, joint swelling and neck pain.  Skin: Negative for rash.  Neurological: Negative for dizziness, tremors, speech difficulty and weakness.  Psychiatric/Behavioral: Negative for agitation, dysphoric mood, sleep disturbance and suicidal ideas. The patient is not nervous/anxious.     Objective:  BP 124/78 (BP Location: Left Arm, Patient Position: Sitting, Cuff Size: Large)   Pulse 80   Temp 97.9 F (36.6 C) (Oral)   Ht 5\' 8"  (1.727 m)   Wt 211 lb 0.6 oz  (95.7 kg)   SpO2 98%   BMI 32.09 kg/m   BP Readings from Last 3 Encounters:  12/17/16 124/78  12/15/15 120/60  06/16/15 110/70    Wt Readings from Last 3 Encounters:  12/17/16 211 lb 0.6 oz (95.7 kg)  12/15/15 210 lb (95.3 kg)  06/16/15 213 lb (96.6 kg)    Physical Exam  Constitutional: He is oriented to person, place, and time. He appears well-developed. No distress.  NAD  HENT:  Mouth/Throat: Oropharynx is clear and moist.  Eyes: Conjunctivae are normal. Pupils are equal, round, and reactive to light.  Neck: Normal range of motion. No JVD present. No thyromegaly present.  Cardiovascular: Normal rate, regular rhythm, normal heart sounds and intact distal pulses.  Exam reveals no gallop and no friction rub.   No murmur heard. Pulmonary/Chest: Effort normal and breath sounds normal. No respiratory distress. He has no wheezes. He has no rales. He exhibits no tenderness.  Abdominal: Soft. Bowel sounds are normal. He exhibits no distension and no mass. There is no tenderness. There is no rebound and no guarding.  Musculoskeletal: Normal range of motion. He exhibits no edema or tenderness.  Lymphadenopathy:    He has no cervical adenopathy.  Neurological: He is alert and oriented to person, place, and time. He has normal reflexes. No cranial nerve deficit. He exhibits normal muscle tone. He displays a negative Romberg sign. Coordination and gait normal.  Skin: Skin is warm and dry. No rash noted.  Psychiatric: He has a normal  mood and affect. His behavior is normal. Judgment and thought content normal.  trace edema - ankle Brown lesion on R back and pink on L shoulder AKs - face, scalp   Lab Results  Component Value Date   WBC 7.5 12/15/2015   HGB 16.3 12/15/2015   HCT 48.0 12/15/2015   PLT 228.0 12/15/2015   GLUCOSE 93 12/15/2015   CHOL 235 (H) 12/15/2015   TRIG 99.0 12/15/2015   HDL 66.30 12/15/2015   LDLDIRECT 169.7 06/01/2013   LDLCALC 149 (H) 12/15/2015   ALT 22  12/15/2015   AST 21 12/15/2015   NA 139 12/15/2015   K 5.0 12/15/2015   CL 104 12/15/2015   CREATININE 1.08 12/15/2015   BUN 14 12/15/2015   CO2 29 12/15/2015   TSH 1.05 12/15/2015   PSA 0.38 12/15/2015   INR 0.97 11/22/2012    Dg Wrist 2 Views Left  Result Date: 12/01/2012 *RADIOLOGY REPORT* Clinical Data: Wrist pain. LEFT WRIST - 2 VIEW,DG C-ARM 61-120 MIN Comparison: 11/22/2012. Findings: Open reduction internal fixation comminuted distal radius fracture.  Improved position and alignment. IMPRESSION: As above. Original Report Authenticated By: Rolla Flatten, M.D.   Dg C-arm [phone] Min  Result Date: 12/01/2012 *RADIOLOGY REPORT* Clinical Data: Wrist pain. LEFT WRIST - 2 VIEW,DG C-ARM 61-120 MIN Comparison: 11/22/2012. Findings: Open reduction internal fixation comminuted distal radius fracture.  Improved position and alignment. IMPRESSION: As above. Original Report Authenticated By: Rolla Flatten, M.D.    Assessment & Plan:   There are no diagnoses linked to this encounter. I am having Mr. Elgersma maintain his aspirin, fluticasone, amLODipine, valACYclovir, valsartan, and zolpidem.  No orders of the defined types were placed in this encounter.    Follow-up: No Follow-up on file.  Walker Kehr, MD

## 2016-12-17 NOTE — Addendum Note (Signed)
Addended by: Karren Cobble on: 12/17/2016 09:29 AM   Modules accepted: Orders

## 2016-12-17 NOTE — Assessment & Plan Note (Signed)
We discussed age appropriate health related issues, including available/recomended screening tests and vaccinations. We discussed a need for adhering to healthy diet and exercise. Labs ordered. All questions were answered. 

## 2016-12-17 NOTE — Assessment & Plan Note (Signed)
sch cryo procedure

## 2016-12-17 NOTE — Assessment & Plan Note (Signed)
Skin bx w/me Triamc

## 2016-12-17 NOTE — Patient Instructions (Signed)
Skin bx w/me 

## 2016-12-17 NOTE — Progress Notes (Signed)
Pre visit review using our clinic review tool, if applicable. No additional management support is needed unless otherwise documented below in the visit note. 

## 2016-12-24 ENCOUNTER — Encounter: Payer: Self-pay | Admitting: Internal Medicine

## 2016-12-24 ENCOUNTER — Ambulatory Visit (INDEPENDENT_AMBULATORY_CARE_PROVIDER_SITE_OTHER): Payer: 59 | Admitting: Internal Medicine

## 2016-12-24 DIAGNOSIS — D485 Neoplasm of uncertain behavior of skin: Secondary | ICD-10-CM | POA: Diagnosis not present

## 2016-12-24 DIAGNOSIS — L570 Actinic keratosis: Secondary | ICD-10-CM | POA: Diagnosis not present

## 2016-12-24 NOTE — Progress Notes (Signed)
Pre visit review using our clinic review tool, if applicable. No additional management support is needed unless otherwise documented below in the visit note. 

## 2016-12-24 NOTE — Assessment & Plan Note (Signed)
cryo 

## 2016-12-24 NOTE — Progress Notes (Signed)
Subjective:  Patient ID: Raymond Snow, male    DOB: 1950/09/05  Age: 66 y.o. MRN: [id number]  CC: No chief complaint on file.   HPI ROMEY MATHIESON presents for skin bx and a L cheek AK  Outpatient Medications Prior to Visit  Medication Sig Dispense Refill  . amLODipine (NORVASC) 10 MG tablet Take 1 tablet (10 mg total) by mouth daily. 90 tablet 3  . aspirin 81 MG chewable tablet Chew 81 mg by mouth daily.      . fluticasone (FLONASE) 50 MCG/ACT nasal spray Place 1-2 sprays into both nostrils daily as needed for rhinitis. 48 g 3  . triamcinolone cream (KENALOG) 0.5 % Apply 1 application topically 3 (three) times daily. 30 g 1  . valACYclovir (VALTREX) 1000 MG tablet Take 0.5 tablets (500 mg total) by mouth 2 (two) times daily. 180 tablet 3  . valsartan (DIOVAN) 320 MG tablet TAKE (1) TABLET DAILY IN THE MORNING. 90 tablet 3  . zolpidem (AMBIEN) 10 MG tablet Take 1 tablet (10 mg total) by mouth at bedtime as needed for sleep. 90 tablet 3   No facility-administered medications prior to visit.     ROS Review of Systems  Objective:  BP 110/68 (BP Location: Left Arm, Patient Position: Sitting, Cuff Size: Large)   Pulse 70   Temp 97.7 F (36.5 C) (Oral)   Ht 5\' 8"  (1.727 m)   Wt 214 lb 1.9 oz (97.1 kg)   SpO2 98%   BMI 32.56 kg/m   BP Readings from Last 3 Encounters:  12/24/16 110/68  12/17/16 124/78  12/15/15 120/60    Wt Readings from Last 3 Encounters:  12/24/16 214 lb 1.9 oz (97.1 kg)  12/17/16 211 lb 0.6 oz (95.7 kg)  12/15/15 210 lb (95.3 kg)    Physical Exam    Procedure Note :     Procedure :  Skin biopsy   Indication:  Changing mole (s ),  Suspicious lesion(s)   Risks including unsuccessful procedure , bleeding, infection, bruising, scar, a need for another complete procedure and others were explained to the patient in detail as well as the benefits. Informed consent was obtained   The patient was placed in a decubitus position.  Lesion #1 on   R  upper back  measuring 11x9    mm   Skin over lesion #1  was prepped with Betadine and alcohol  and anesthetized with 1 cc of 2% lidocaine and epinephrine, using a 25-gauge 1 inch needle.  Shave biopsy with a sterile Dermablade was carried out in the usual fashion. Hyfrecator was used to destroy the rest of the lesion potentially left behind and for hemostasis. Band-Aid was applied with antibiotic ointment.    Lesion #2 on  L prox arm   measuring 11x8  mm   Skin over lesion #2  was prepped with Betadine and alcohol  and anesthetized with 1 cc of 2% lidocaine and epinephrine, using a 25-gauge 1 inch needle.  Shave biopsy with a sterile Dermablade was carried out in the usual fashion. Hyfrecator was used to destroy the rest of the lesion potentially left behind and for hemostasis. Band-Aid was applied with antibiotic ointment.     Tolerated well. Complications none.     Procedure Note :     Procedure : Cryosurgery   Indication:  Actinic keratosis(es)   Risks including unsuccessful procedure , bleeding, infection, bruising, scar, a need for a repeat  procedure and others were explained to  the patient in detail as well as the benefits. Informed consent was obtained verbally.   1  lesion(s)  on L cheek   was/were treated with liquid nitrogen on a Q-tip in a usual fasion . Band-Aid was applied and antibiotic ointment was given for a later use.   Tolerated well. Complications none.        Lab Results  Component Value Date   WBC 7.3 12/17/2016   HGB 16.8 12/17/2016   HCT 49.5 12/17/2016   PLT 235.0 12/17/2016   GLUCOSE 97 12/17/2016   CHOL 216 (H) 12/17/2016   TRIG 83.0 12/17/2016   HDL 68.70 12/17/2016   LDLDIRECT 169.7 06/01/2013   LDLCALC 130 (H) 12/17/2016   ALT 18 12/17/2016   AST 21 12/17/2016   NA 139 12/17/2016   K 4.3 12/17/2016   CL 105 12/17/2016   CREATININE 1.17 12/17/2016   BUN 16 12/17/2016   CO2 25 12/17/2016   TSH 1.18 12/17/2016   PSA 0.37 12/17/2016    INR 0.97 11/22/2012    Dg Wrist 2 Views Left  Result Date: 12/01/2012 *RADIOLOGY REPORT* Clinical Data: Wrist pain. LEFT WRIST - 2 VIEW,DG C-ARM 61-120 MIN Comparison: 11/22/2012. Findings: Open reduction internal fixation comminuted distal radius fracture.  Improved position and alignment. IMPRESSION: As above. Original Report Authenticated By: Rolla Flatten, M.D.   Dg C-arm [phone] Min  Result Date: 12/01/2012 *RADIOLOGY REPORT* Clinical Data: Wrist pain. LEFT WRIST - 2 VIEW,DG C-ARM 61-120 MIN Comparison: 11/22/2012. Findings: Open reduction internal fixation comminuted distal radius fracture.  Improved position and alignment. IMPRESSION: As above. Original Report Authenticated By: Rolla Flatten, M.D.    Assessment & Plan:   There are no diagnoses linked to this encounter. I am having Mr. Ziegler maintain his aspirin, fluticasone, valsartan, amLODipine, zolpidem, valACYclovir, and triamcinolone cream.  No orders of the defined types were placed in this encounter.    Follow-up: No Follow-up on file.  Walker Kehr, MD

## 2016-12-24 NOTE — Patient Instructions (Signed)
Postprocedure instructions :    A Band-Aid should be  changed twice daily. You can take a shower tomorrow.  Keep the wounds clean. You can wash them with liquid soap and water. Pat dry with gauze or a Kleenex tissue  Before applying antibiotic ointment and a Band-Aid.   You need to report immediately  if fever, chills or any signs of infection develop.    The biopsy results should be available in 1 -2 weeks. 

## 2016-12-24 NOTE — Assessment & Plan Note (Signed)
See procedure 

## 2016-12-25 ENCOUNTER — Telehealth: Payer: Self-pay | Admitting: Internal Medicine

## 2016-12-25 NOTE — Telephone Encounter (Signed)
Raymond Snow, Please inform the patient that his skin bx was benign Thanks, AP

## 2016-12-26 NOTE — Telephone Encounter (Signed)
LM notifying pt

## 2016-12-31 ENCOUNTER — Encounter: Payer: Self-pay | Admitting: Internal Medicine

## 2017-01-21 ENCOUNTER — Other Ambulatory Visit: Payer: Self-pay | Admitting: Internal Medicine

## 2017-04-04 ENCOUNTER — Telehealth: Payer: Self-pay | Admitting: Internal Medicine

## 2017-04-04 MED ORDER — IRBESARTAN 300 MG PO TABS: 300.0000 mg | ORAL_TABLET | Freq: Every day | ORAL | 3 refills | Status: DC

## 2017-04-04 NOTE — Telephone Encounter (Signed)
Emailed Irbesartan Rx Thx

## 2017-04-04 NOTE — Telephone Encounter (Signed)
Requesting a script in place of valsartan 320 mg tab

## 2017-06-25 ENCOUNTER — Encounter: Payer: Self-pay | Admitting: Internal Medicine

## 2017-06-26 ENCOUNTER — Other Ambulatory Visit: Payer: Self-pay | Admitting: Internal Medicine

## 2017-06-26 NOTE — Telephone Encounter (Signed)
rx printed,signed and will be fax to [facility].

## 2017-12-18 ENCOUNTER — Ambulatory Visit: Payer: 59 | Admitting: Internal Medicine

## 2017-12-19 ENCOUNTER — Ambulatory Visit (INDEPENDENT_AMBULATORY_CARE_PROVIDER_SITE_OTHER): Payer: 59 | Admitting: Internal Medicine

## 2017-12-19 ENCOUNTER — Ambulatory Visit (INDEPENDENT_AMBULATORY_CARE_PROVIDER_SITE_OTHER)
Admission: RE | Admit: 2017-12-19 | Discharge: 2017-12-19 | Disposition: A | Discharge status: Home / Self Care | Payer: 59 | Source: Ambulatory Visit | Attending: Internal Medicine | Admitting: Internal Medicine

## 2017-12-19 ENCOUNTER — Other Ambulatory Visit (INDEPENDENT_AMBULATORY_CARE_PROVIDER_SITE_OTHER): Payer: 59

## 2017-12-19 ENCOUNTER — Encounter: Payer: Self-pay | Admitting: Internal Medicine

## 2017-12-19 VITALS — BP 110/72 | HR 70 | Temp 98.3°F | Ht 68.0 in | Wt 207.0 lb

## 2017-12-19 DIAGNOSIS — I10 Essential (primary) hypertension: Secondary | ICD-10-CM

## 2017-12-19 DIAGNOSIS — R002 Palpitations: Secondary | ICD-10-CM | POA: Diagnosis not present

## 2017-12-19 DIAGNOSIS — Z0000 Encounter for general adult medical examination without abnormal findings: Secondary | ICD-10-CM | POA: Diagnosis not present

## 2017-12-19 DIAGNOSIS — Z23 Encounter for immunization: Secondary | ICD-10-CM | POA: Diagnosis not present

## 2017-12-19 DIAGNOSIS — R05 Cough: Secondary | ICD-10-CM

## 2017-12-19 DIAGNOSIS — R059 Cough, unspecified: Secondary | ICD-10-CM

## 2017-12-19 DIAGNOSIS — F5101 Primary insomnia: Secondary | ICD-10-CM

## 2017-12-19 LAB — CBC WITH DIFFERENTIAL/PLATELET
BASOS PCT: 1.5 % (ref 0.0–3.0)
Basophils Absolute: 0.1 10*3/uL (ref 0.0–0.1)
EOS ABS: 0.2 10*3/uL (ref 0.0–0.7)
EOS PCT: 3 % (ref 0.0–5.0)
HEMATOCRIT: 48.5 % (ref 39.0–52.0)
Hemoglobin: 16.2 g/dL (ref 13.0–17.0)
Lymphocytes Relative: 22.8 % (ref 12.0–46.0)
Lymphs Abs: 1.6 10*3/uL (ref 0.7–4.0)
MCHC: 33.3 g/dL (ref 30.0–36.0)
MCV: 96.2 fl (ref 78.0–100.0)
MONO ABS: 0.6 10*3/uL (ref 0.1–1.0)
Monocytes Relative: 8.6 % (ref 3.0–12.0)
NEUTROS ABS: 4.4 10*3/uL (ref 1.4–7.7)
Neutrophils Relative %: 64.1 % (ref 43.0–77.0)
PLATELETS: 254 10*3/uL (ref 150.0–400.0)
RBC: 5.04 Mil/uL (ref 4.22–5.81)
RDW: 14.9 % (ref 11.5–15.5)
WBC: 6.8 10*3/uL (ref 4.0–10.5)

## 2017-12-19 LAB — URINALYSIS
Bilirubin Urine: NEGATIVE
HGB URINE DIPSTICK: NEGATIVE
Ketones, ur: NEGATIVE
Leukocytes, UA: NEGATIVE
Nitrite: NEGATIVE
PH: 5.5 (ref 5.0–8.0)
SPECIFIC GRAVITY, URINE: 1.025 (ref 1.000–1.030)
TOTAL PROTEIN, URINE-UPE24: NEGATIVE
URINE GLUCOSE: NEGATIVE
Urobilinogen, UA: 0.2 (ref 0.0–1.0)

## 2017-12-19 LAB — HEPATIC FUNCTION PANEL
ALBUMIN: 4.2 g/dL (ref 3.5–5.2)
ALT: 15 U/L (ref 0–53)
AST: 17 U/L (ref 0–37)
Alkaline Phosphatase: 87 U/L (ref 39–117)
Bilirubin, Direct: 0.2 mg/dL (ref 0.0–0.3)
Total Bilirubin: 0.8 mg/dL (ref 0.2–1.2)
Total Protein: 7.2 g/dL (ref 6.0–8.3)

## 2017-12-19 LAB — LIPID PANEL
CHOLESTEROL: 201 mg/dL — AB (ref 0–200)
HDL: 70.6 mg/dL (ref >39.00)
LDL Cholesterol: 118 mg/dL — ABNORMAL HIGH (ref 0–99)
NonHDL: 130.44
TRIGLYCERIDES: 61 mg/dL (ref 0.0–149.0)
Total CHOL/HDL Ratio: 3
VLDL: 12.2 mg/dL (ref 0.0–40.0)

## 2017-12-19 LAB — BASIC METABOLIC PANEL
BUN: 12 mg/dL (ref 6–23)
CO2: 29 mEq/L (ref 19–32)
Calcium: 9.7 mg/dL (ref 8.4–10.5)
Chloride: 103 mEq/L (ref 96–112)
Creatinine, Ser: 1.14 mg/dL (ref 0.40–1.50)
GFR: 68.2 mL/min (ref >60.00)
Glucose, Bld: 93 mg/dL (ref 70–99)
POTASSIUM: 4.5 meq/L (ref 3.5–5.1)
SODIUM: 140 meq/L (ref 135–145)

## 2017-12-19 LAB — TSH: TSH: 1.41 u[IU]/mL (ref 0.35–4.50)

## 2017-12-19 LAB — PSA: PSA: 0.82 ng/mL (ref 0.10–4.00)

## 2017-12-19 MED ORDER — VALACYCLOVIR HCL 1 G PO TABS: ORAL_TABLET | ORAL | 3 refills | Status: DC

## 2017-12-19 MED ORDER — IRBESARTAN 300 MG PO TABS: 300.0000 mg | ORAL_TABLET | Freq: Every day | ORAL | 3 refills | Status: DC

## 2017-12-19 MED ORDER — AMLODIPINE BESYLATE 10 MG PO TABS: ORAL_TABLET | ORAL | 3 refills | Status: DC

## 2017-12-19 MED ORDER — ZOLPIDEM TARTRATE 10 MG PO TABS: ORAL_TABLET | ORAL | 1 refills | Status: DC

## 2017-12-19 NOTE — Assessment & Plan Note (Signed)
On Zolpidem  Potential benefits of a long term benzodiazepines  use as well as potential risks  and complications were explained to the patient and were aknowledged. 

## 2017-12-19 NOTE — Patient Instructions (Signed)

## 2017-12-19 NOTE — Progress Notes (Signed)
Subjective:  Patient ID: Raymond Snow, male    DOB: 12-07-1950  Age: 67 y.o. MRN: [id number]  CC: No chief complaint on file.   HPI Raymond Snow presents for a well exam occ cough, palpitations  Outpatient Medications Prior to Visit  Medication Sig Dispense Refill  . amLODipine (NORVASC) 10 MG tablet TAKE (1) TABLET DAILY IN THE MORNING. 90 tablet 2  . aspirin 81 MG chewable tablet Chew 81 mg by mouth daily.      . fluticasone (FLONASE) 50 MCG/ACT nasal spray Place 1-2 sprays into both nostrils daily as needed for rhinitis. 48 g 3  . irbesartan (AVAPRO) 300 MG tablet Take 1 tablet (300 mg total) by mouth daily. 90 tablet 3  . valACYclovir (VALTREX) 1000 MG tablet TAKE (1/2) TABLET TWICE DAILY. 90 tablet 2  . zolpidem (AMBIEN) 10 MG tablet TAKE 1 TABLET AT BEDTIME IF NEEDED FOR SLEEP. 90 tablet 1   No facility-administered medications prior to visit.     ROS Review of Systems  Constitutional: Negative for appetite change, fatigue and unexpected weight change.  HENT: Negative for congestion, nosebleeds, sneezing, sore throat and trouble swallowing.   Eyes: Negative for itching and visual disturbance.  Respiratory: Negative for chest tightness, shortness of breath and wheezing.   Cardiovascular: Negative for chest pain and leg swelling.  Gastrointestinal: Negative for abdominal distention, blood in stool, diarrhea and nausea.  Genitourinary: Negative for frequency and hematuria.  Musculoskeletal: Negative for back pain, gait problem, joint swelling and neck pain.  Skin: Negative for rash.  Neurological: Negative for dizziness, tremors, speech difficulty and weakness.  Psychiatric/Behavioral: Negative for agitation, dysphoric mood and sleep disturbance. The patient is not nervous/anxious.     Objective:  BP 110/72 (BP Location: Left Arm, Patient Position: Sitting, Cuff Size: Large)   Pulse 70   Temp 98.3 F (36.8 C) (Oral)   Ht 5\' 8"  (1.727 m)   Wt 207 lb (93.9 kg)    SpO2 98%   BMI 31.47 kg/m   BP Readings from Last 3 Encounters:  12/19/17 110/72  12/24/16 110/68  12/17/16 124/78    Wt Readings from Last 3 Encounters:  12/19/17 207 lb (93.9 kg)  12/24/16 214 lb 1.9 oz (97.1 kg)  12/17/16 211 lb 0.6 oz (95.7 kg)    Physical Exam  Constitutional: He is oriented to person, place, and time. He appears well-developed. No distress.  NAD  HENT:  Mouth/Throat: Oropharynx is clear and moist.  Eyes: Pupils are equal, round, and reactive to light. Conjunctivae are normal.  Neck: Normal range of motion. No JVD present. No thyromegaly present.  Cardiovascular: Normal rate, regular rhythm, normal heart sounds and intact distal pulses. Exam reveals no gallop and no friction rub.  No murmur heard. Pulmonary/Chest: Effort normal and breath sounds normal. No respiratory distress. He has no wheezes. He has no rales. He exhibits no tenderness.  Abdominal: Soft. Bowel sounds are normal. He exhibits no distension and no mass. There is no tenderness. There is no rebound and no guarding.  Genitourinary: Rectal exam shows guaiac negative stool.  Musculoskeletal: Normal range of motion. He exhibits no edema or tenderness.  Lymphadenopathy:    He has no cervical adenopathy.  Neurological: He is alert and oriented to person, place, and time. He has normal reflexes. No cranial nerve deficit. He exhibits normal muscle tone. He displays a negative Romberg sign. Coordination and gait normal.  Skin: Skin is warm and dry. No rash noted.  Psychiatric: He  has a normal mood and affect. His behavior is normal. Judgment and thought content normal.   Prostate 1+   Procedure: EKG Indication: palpitations Impression: NSR. No acute changes.  Lab Results  Component Value Date   WBC 7.3 12/17/2016   HGB 16.8 12/17/2016   HCT 49.5 12/17/2016   PLT 235.0 12/17/2016   GLUCOSE 97 12/17/2016   CHOL 216 (H) 12/17/2016   TRIG 83.0 12/17/2016   HDL 68.70 12/17/2016   LDLDIRECT  169.7 06/01/2013   LDLCALC 130 (H) 12/17/2016   ALT 18 12/17/2016   AST 21 12/17/2016   NA 139 12/17/2016   K 4.3 12/17/2016   CL 105 12/17/2016   CREATININE 1.17 12/17/2016   BUN 16 12/17/2016   CO2 25 12/17/2016   TSH 1.18 12/17/2016   PSA 0.37 12/17/2016   INR 0.97 11/22/2012    Dg Wrist 2 Views Left  Result Date: 12/01/2012 *RADIOLOGY REPORT* Clinical Data: Wrist pain. LEFT WRIST - 2 VIEW,DG C-ARM 61-120 MIN Comparison: 11/22/2012. Findings: Open reduction internal fixation comminuted distal radius fracture.  Improved position and alignment. IMPRESSION: As above. Original Report Authenticated By: Rolla Flatten, M.D.   Dg C-arm [phone] Min  Result Date: 12/01/2012 *RADIOLOGY REPORT* Clinical Data: Wrist pain. LEFT WRIST - 2 VIEW,DG C-ARM 61-120 MIN Comparison: 11/22/2012. Findings: Open reduction internal fixation comminuted distal radius fracture.  Improved position and alignment. IMPRESSION: As above. Original Report Authenticated By: Rolla Flatten, M.D.    Assessment & Plan:   There are no diagnoses linked to this encounter. I am having Raymond Snow maintain his aspirin, fluticasone, valACYclovir, amLODipine, irbesartan, and zolpidem.  No orders of the defined types were placed in this encounter.    Follow-up: No follow-ups on file.  Walker Kehr, MD

## 2017-12-19 NOTE — Assessment & Plan Note (Signed)
Norvasc Valsartan

## 2017-12-19 NOTE — Assessment & Plan Note (Signed)
We discussed age appropriate health related issues, including available/recomended screening tests and vaccinations. We discussed a need for adhering to healthy diet and exercise. Labs were ordered to be later reviewed . All questions were answered.  Colon due 2021 Shingrix

## 2017-12-20 ENCOUNTER — Encounter: Payer: Self-pay | Admitting: Internal Medicine

## 2017-12-20 ENCOUNTER — Other Ambulatory Visit: Payer: Self-pay | Admitting: Internal Medicine

## 2017-12-20 DIAGNOSIS — R911 Solitary pulmonary nodule: Secondary | ICD-10-CM

## 2017-12-24 ENCOUNTER — Ambulatory Visit (INDEPENDENT_AMBULATORY_CARE_PROVIDER_SITE_OTHER)
Admission: RE | Admit: 2017-12-24 | Discharge: 2017-12-24 | Disposition: A | Discharge status: Home / Self Care | Payer: 59 | Source: Ambulatory Visit | Attending: Internal Medicine | Admitting: Internal Medicine

## 2017-12-24 DIAGNOSIS — R911 Solitary pulmonary nodule: Secondary | ICD-10-CM | POA: Diagnosis not present

## 2017-12-24 MED ORDER — IOPAMIDOL (ISOVUE-300) INJECTION 61%
80.0000 mL | Freq: Once | INTRAVENOUS | Status: AC | PRN
  Administered 2017-12-24: 80 mL via INTRAVENOUS

## 2017-12-26 ENCOUNTER — Other Ambulatory Visit: Payer: Self-pay | Admitting: Internal Medicine

## 2017-12-26 DIAGNOSIS — R9389 Abnormal findings on diagnostic imaging of other specified body structures: Secondary | ICD-10-CM

## 2018-01-01 ENCOUNTER — Encounter: Payer: Self-pay | Admitting: Internal Medicine

## 2018-02-06 ENCOUNTER — Encounter: Payer: Self-pay | Admitting: Internal Medicine

## 2018-02-06 ENCOUNTER — Ambulatory Visit (INDEPENDENT_AMBULATORY_CARE_PROVIDER_SITE_OTHER): Payer: 59 | Admitting: Internal Medicine

## 2018-02-06 ENCOUNTER — Other Ambulatory Visit (INDEPENDENT_AMBULATORY_CARE_PROVIDER_SITE_OTHER): Payer: 59

## 2018-02-06 ENCOUNTER — Ambulatory Visit (INDEPENDENT_AMBULATORY_CARE_PROVIDER_SITE_OTHER)
Admission: RE | Admit: 2018-02-06 | Discharge: 2018-02-06 | Disposition: A | Discharge status: Home / Self Care | Payer: 59 | Source: Ambulatory Visit | Attending: Internal Medicine | Admitting: Internal Medicine

## 2018-02-06 VITALS — BP 118/74 | HR 83 | Ht 68.0 in | Wt 205.6 lb

## 2018-02-06 DIAGNOSIS — R918 Other nonspecific abnormal finding of lung field: Secondary | ICD-10-CM

## 2018-02-06 LAB — SEDIMENTATION RATE: Sed Rate: 7 mm/hr (ref 0–20)

## 2018-02-06 NOTE — Progress Notes (Signed)
Spoke with pt and notified of results per Dr. Wert. Pt verbalized understanding and denied any questions. 

## 2018-02-06 NOTE — Assessment & Plan Note (Addendum)
Incidental 12/19/17 by cxr  >  GG changes just in RUL on CT 12/24/17 - ESR 02/06/2018  = - Allergy profile 02/06/2018 >  Eos 0.2 /  IgE  Pending  - Quant GOLD TB  02/06/2018   Prior CT from 11/22/2012 neg (done p trauma)  Miscellaneous:Alv microlithiasis, alv proteinosis, asp, bronchiectais, BOOP >> ESR  02/06/2018 =  ARDS/ AIP Occupational dz/ HSP Neoplasm esp BAC so if persists on plain cxr with no obvious cause will need vats bx/ consider RULobectomy Infection  Esp tb  >  Quant TB 02/06/2018 =  Drug  Pulmonary emboli, Protein disorders Edema/Eosinophilic dz>  Check allergy profile 02/06/2018  Sarcoidosis Connective tissue dz> unlikely x for BOOP  just in RUL  Hist X / Hemorrhage> no assoc hemoptysis, not on anticoagulation other than baby asa   Idiopathic    Discussed in detail all the  indications, usual  risks and alternatives  relative to the benefits with patient who agrees to proceed with w/u as outlined.     Total time devoted to counseling  > 50 % of initial 60 min office visit:  review case with pt/ discussion of options/alternatives/ personally creating written customized instructions  in presence of pt  then going over those specific  Instructions directly with the pt including how to use all of the meds but in particular covering each new medication in detail and the difference between the maintenance= "automatic" meds and the prns using an action plan format for the latter (If this problem/symptom => do that organization reading Left to right).  Please see AVS from this visit for a full list of these instructions which I personally wrote for this pt and  are unique to this visit.    Late add:  cxr reviewed and is completely clear where had very conspicuous infiltrate and ERS only 7 so no further w/u needed at this point/ pt advised by phone/ f/u pulmonary prn  

## 2018-02-06 NOTE — Progress Notes (Signed)
Subjective:     Patient ID: Raymond Snow, male   DOB: Oct 16, 1950,    MRN: [id number]  HPI  61 yowm quit msoking 1988 due to cost no resp symptoms and referred to pulmonary clinic 02/06/2018 by Dr   Alain Marion for abn cxr .   02/06/2018 1st [facility] office visit/ Raymond Snow   Chief Complaint  Patient presents with  . Pulm Consult    Referred by Dr. Alain Marion for abnormal CT chest that was performed on 12/25/17. Patient denies any breathing concerns.   h/o HH with recurrent midline  cp's x years, resolves with belching   X decades Sneezing when bends over new since 2 weeks prior to OV  Otherwise no other symptoms   No obvious day to day or daytime variability or assoc excess/ purulent sputum or mucus plugs or hemoptysis or cp or chest tightness, subjective wheeze or overt sinus or hb symptoms. No unusual exposure hx or h/o childhood pna/ asthma or knowledge of premature birth.  Sleeping flat   without nocturnal  or early am exacerbation  of respiratory  c/o's or need for noct saba. Also denies any obvious fluctuation of symptoms with weather or environmental changes or other aggravating or alleviating factors except as outlined above   Current Allergies, Complete Past Medical History, Past Surgical History, Family History, and Social History were reviewed in [employer] record.  ROS  The following are not active complaints unless bolded Hoarseness, sore throat, dysphagia, dental problems, itching, sneezing,  nasal congestion or discharge of excess mucus or purulent secretions, ear ache,   fever, chills, sweats, unintended wt loss or wt gain, classically pleuritic or exertional cp,  orthopnea pnd or arm/hand swelling  or leg swelling, presyncope, palpitations, abdominal pain, anorexia, nausea, vomiting, diarrhea  or change in bowel habits or change in bladder habits, change in stools or change in urine, dysuria, hematuria,  rash, arthralgias, visual complaints, headache,  numbness, weakness or ataxia or problems with walking or coordination,  change in mood or  memory.        Current Meds  Medication Sig  . amLODipine (NORVASC) 10 MG tablet TAKE (1) TABLET DAILY IN THE MORNING.  Marland Kitchen aspirin 81 MG chewable tablet Chew 81 mg by mouth daily.    . fluticasone (FLONASE) 50 MCG/ACT nasal spray Place 1-2 sprays into both nostrils daily as needed for rhinitis.  Marland Kitchen irbesartan (AVAPRO) 300 MG tablet Take 1 tablet (300 mg total) by mouth daily.  . valACYclovir (VALTREX) 1000 MG tablet TAKE (1/2) TABLET TWICE DAILY.  Marland Kitchen zolpidem (AMBIEN) 10 MG tablet TAKE 1 TABLET AT BEDTIME IF NEEDED FOR SLEEP.         Review of Systems     Objective:   Physical Exam    amb pleasant healthy appearing wm nad   Wt Readings from Last 3 Encounters:  02/06/18 205 lb 9.6 oz (93.3 kg)  12/19/17 207 lb (93.9 kg)  12/24/16 214 lb 1.9 oz (97.1 kg)     Vital signs reviewed - Note on arrival 02 sats  95% on RA      HEENT: nl dentition, turbinates bilaterally, and oropharynx. Nl external ear canals without cough reflex   NECK :  without JVD/Nodes/TM/ nl carotid upstrokes bilaterally   LUNGS: no acc muscle use,  Nl contour chest which is clear to A and P bilaterally without cough on insp or exp maneuvers   CV:  RRR  no s3 or murmur or increase in P2,  and no edema   ABD:  soft and nontender with nl inspiratory excursion in the supine position. No bruits or organomegaly appreciated, bowel sounds nl  MS:  Nl gait/ ext warm without deformities, calf tenderness, cyanosis or clubbing No obvious joint restrictions   SKIN: warm and dry without lesions    NEURO:  alert, approp, nl sensorium with  no motor or cerebellar deficits apparent.     CXR PA and Lateral:   02/06/2018 :    I personally reviewed images and agree with radiology impression as follows:   Resolution of previously seen right upper lobe infiltrate  Labs ordered 02/06/2018  Allergy profile     Lab Results   Component Value Date   ESRSEDRATE 7 02/06/2018         Assessment:

## 2018-02-06 NOTE — Patient Instructions (Signed)
Please remember to go to the lab and x-ray department downstairs in the basement  for your tests - we will call you with the results when they are available.

## 2018-02-08 LAB — RESPIRATORY ALLERGY PROFILE REGION II ~~LOC~~
Allergen, A. alternata, m6: <0.1 kU/L
Allergen, Cedar tree, t12: <0.1 kU/L
Allergen, Comm Silver Birch, t9: <0.1 kU/L
Allergen, Cottonwood, t14: <0.1 kU/L
Allergen, Mouse Urine Protein, e78: <0.1 kU/L
Allergen, Mulberry, t76: <0.1 kU/L
Allergen, Oak,t7: <0.1 kU/L
Allergen, P. notatum, m1: <0.1 kU/L
Aspergillus fumigatus, m3: <0.1 kU/L
Bermuda Grass: <0.1 kU/L
Box Elder IgE: <0.1 kU/L
CLASS: 0
CLASS: 0
CLASS: 0
CLASS: 0
CLASS: 0
CLASS: 0
CLASS: 0
CLASS: 0
CLASS: 0
CLASS: 0
CLASS: 0
CLASS: 0
CLASS: 0
CLASS: 0
CLASS: 0
CLASS: 0
COMMON RAGWEED (SHORT) (W1) IGE: <0.1 kU/L
Class: 0
Class: 0
Class: 0
Class: 0
Class: 0
Class: 0
Class: 0
Class: 0
Cockroach: <0.1 kU/L
D. farinae: <0.1 kU/L
Dog Dander: <0.1 kU/L
IGE (IMMUNOGLOBULIN E), SERUM: 3 kU/L (ref <=114)
Pecan/Hickory Tree IgE: <0.1 kU/L
Rough Pigweed  IgE: <0.1 kU/L
Timothy Grass: <0.1 kU/L

## 2018-02-08 LAB — QUANTIFERON-TB GOLD PLUS
Mitogen-NIL: >10 IU/mL
NIL: 0.04 IU/mL
QuantiFERON-TB Gold Plus: NEGATIVE
TB2-NIL: 0 [IU]/mL

## 2018-02-08 LAB — INTERPRETATION:

## 2018-02-10 NOTE — Progress Notes (Signed)
Spoke with pt and notified of results per Dr. Wert. Pt verbalized understanding and denied any questions. 

## 2018-02-27 ENCOUNTER — Ambulatory Visit (INDEPENDENT_AMBULATORY_CARE_PROVIDER_SITE_OTHER): Payer: 59

## 2018-02-27 DIAGNOSIS — Z299 Encounter for prophylactic measures, unspecified: Secondary | ICD-10-CM | POA: Diagnosis not present

## 2018-05-15 ENCOUNTER — Ambulatory Visit: Payer: 59

## 2018-05-15 ENCOUNTER — Ambulatory Visit (INDEPENDENT_AMBULATORY_CARE_PROVIDER_SITE_OTHER): Payer: 59

## 2018-05-15 DIAGNOSIS — Z23 Encounter for immunization: Secondary | ICD-10-CM | POA: Diagnosis not present

## 2018-05-15 DIAGNOSIS — Z299 Encounter for prophylactic measures, unspecified: Secondary | ICD-10-CM

## 2018-06-04 ENCOUNTER — Other Ambulatory Visit: Payer: Self-pay | Admitting: Internal Medicine

## 2018-06-04 NOTE — Telephone Encounter (Signed)
Copied from [place] [id number]. Topic: Quick Communication - Rx Refill/Question >> Jun 04, 2018  9:19 AM Antonieta Iba C wrote: Medication: zolpidem (AMBIEN) 10 MG tablet   Has the patient contacted their pharmacy? No  (Agent: If no, request that the patient contact the pharmacy for the refill.) (Agent: If yes, when and what did the pharmacy advise?)  Preferred Pharmacy (with phone number or street name): [facility], [place].  Agent: Please be advised that RX refills may take up to 3 business days. We ask that you follow-up with your pharmacy.

## 2018-06-04 NOTE — Telephone Encounter (Signed)
This was a 3 month supply and is not due until 06/21/18 and can wait for PCP

## 2018-06-04 NOTE — Telephone Encounter (Signed)
Denied request.. He is not due until 06/21/18.Marland KitchenJohny Chess

## 2018-06-04 NOTE — Telephone Encounter (Signed)
Requested medication (s) are due for refill today -yes  Requested medication (s) are on the active medication list -yes  Future visit scheduled -yes  Last refill: 12/19/17 #90 1 RF  Notes to clinic: Patient should have some medication left- with no RF. Medication not delegated  Requested Prescriptions  Pending Prescriptions Disp Refills   zolpidem (AMBIEN) 10 MG tablet 90 tablet 1    Sig: TAKE 1 TABLET AT BEDTIME IF NEEDED FOR SLEEP.     Not Delegated - Psychiatry:  Anxiolytics/Hypnotics Failed - 06/04/2018  9:23 AM      Failed - This refill cannot be delegated      Failed - Urine Drug Screen completed in last 360 days.      Passed - Valid encounter within last 6 months    Recent Outpatient Visits          5 months ago Well adult exam   [facility] Plot[facility], Evie Lacks, MD   1 year ago Actinic keratoses   [facility] Plot[facility], Evie Lacks, MD   1 year ago Well adult exam   [facility] Plotnikov, Evie Lacks, MD   2 years ago Well adult exam   [facility] Plot[facility], Evie Lacks, MD   2 years ago Essential hypertension   [facility], Evie Lacks, MD      Future Appointments            In 6 months Plotnikov, Evie Lacks, MD [facility]            Requested Prescriptions  Pending Prescriptions Disp Refills   zolpidem (AMBIEN) 10 MG tablet 90 tablet 1    Sig: TAKE 1 TABLET AT BEDTIME IF NEEDED FOR SLEEP.     Not Delegated - Psychiatry:  Anxiolytics/Hypnotics Failed - 06/04/2018  9:23 AM      Failed - This refill cannot be delegated      Failed - Urine Drug Screen completed in last 360 days.      Passed - Valid encounter within last 6 months    Recent Outpatient Visits          5 months ago Well adult exam   [facility] Plotnikov, Evie Lacks, MD   1 year ago Actinic keratoses   [facility], Evie Lacks, MD   1 year ago Well adult exam   [facility] Plot[facility], Evie Lacks, MD   2 years ago Well adult exam   [facility] Plot[facility], Evie Lacks, MD   2 years ago Essential hypertension   [facility], Evie Lacks, MD      Future Appointments            In 6 months Plotnikov, Evie Lacks, MD [place], [place]

## 2018-06-04 NOTE — Telephone Encounter (Signed)
Check [place] registry last filled 03/22/2018. MD is out of the office until 06/09/18 pls advise on refill.Marland KitchenJohny Chess

## 2018-06-05 ENCOUNTER — Other Ambulatory Visit: Payer: Self-pay | Admitting: Internal Medicine

## 2018-06-10 ENCOUNTER — Other Ambulatory Visit: Payer: Self-pay | Admitting: Internal Medicine

## 2018-06-11 NOTE — Telephone Encounter (Signed)
Every now and then the patient has been taking 1.5 pills at night. Pt is not due for a refill until nov 3 Pt will be about 4 to 5 day short getting to refill date on 06-22-18

## 2018-06-11 NOTE — Telephone Encounter (Signed)
Please advise,  Checked controlled substance database, last filled 03/22/18.

## 2018-09-05 ENCOUNTER — Telehealth: Payer: Self-pay | Admitting: Internal Medicine

## 2018-09-09 ENCOUNTER — Other Ambulatory Visit: Payer: Self-pay | Admitting: Internal Medicine

## 2018-09-10 NOTE — Telephone Encounter (Signed)
Patient called and stated that he has not been able to pick this medication up from pharmacy, due to pharmacy saying it has been filled too early. Patient is out of this medication and states he has been taking it directly as directed. I contacted pharmacy and was advised that in order for patient to have this medication filled before 1/26- Dr. Alain Marion or his nurse would have to give approval directly to pharmacy. Please advise.

## 2018-09-10 NOTE — Telephone Encounter (Signed)
Faxed back this morning giving the okay to fill

## 2018-12-09 MED ORDER — ZOLPIDEM TARTRATE 10 MG PO TABS: ORAL_TABLET | ORAL | 1 refills | Status: DC

## 2018-12-09 MED ORDER — AMLODIPINE BESYLATE 10 MG PO TABS: ORAL_TABLET | ORAL | 3 refills | Status: DC

## 2018-12-09 MED ORDER — IRBESARTAN 300 MG PO TABS: 300.0000 mg | ORAL_TABLET | Freq: Every day | ORAL | 3 refills | Status: DC

## 2018-12-09 MED ORDER — VALACYCLOVIR HCL 1 G PO TABS: ORAL_TABLET | ORAL | 3 refills | Status: DC

## 2018-12-22 ENCOUNTER — Encounter: Payer: 59 | Admitting: Internal Medicine

## 2019-01-15 ENCOUNTER — Encounter: Payer: Self-pay | Admitting: Internal Medicine

## 2019-01-15 ENCOUNTER — Ambulatory Visit (INDEPENDENT_AMBULATORY_CARE_PROVIDER_SITE_OTHER): Payer: 59 | Admitting: Internal Medicine

## 2019-01-15 ENCOUNTER — Other Ambulatory Visit (INDEPENDENT_AMBULATORY_CARE_PROVIDER_SITE_OTHER): Payer: 59

## 2019-01-15 ENCOUNTER — Other Ambulatory Visit: Payer: Self-pay

## 2019-01-15 ENCOUNTER — Ambulatory Visit (INDEPENDENT_AMBULATORY_CARE_PROVIDER_SITE_OTHER)
Admission: RE | Admit: 2019-01-15 | Discharge: 2019-01-15 | Disposition: A | Discharge status: Home / Self Care | Payer: 59 | Source: Ambulatory Visit | Attending: Internal Medicine | Admitting: Internal Medicine

## 2019-01-15 VITALS — BP 120/72 | HR 82 | Temp 98.0°F | Ht 68.0 in | Wt 207.0 lb

## 2019-01-15 DIAGNOSIS — R918 Other nonspecific abnormal finding of lung field: Secondary | ICD-10-CM

## 2019-01-15 DIAGNOSIS — E785 Hyperlipidemia, unspecified: Secondary | ICD-10-CM

## 2019-01-15 DIAGNOSIS — Z0000 Encounter for general adult medical examination without abnormal findings: Secondary | ICD-10-CM | POA: Diagnosis not present

## 2019-01-15 DIAGNOSIS — I10 Essential (primary) hypertension: Secondary | ICD-10-CM

## 2019-01-15 DIAGNOSIS — F5101 Primary insomnia: Secondary | ICD-10-CM

## 2019-01-15 DIAGNOSIS — R9389 Abnormal findings on diagnostic imaging of other specified body structures: Secondary | ICD-10-CM

## 2019-01-15 DIAGNOSIS — Z125 Encounter for screening for malignant neoplasm of prostate: Secondary | ICD-10-CM | POA: Diagnosis not present

## 2019-01-15 DIAGNOSIS — I2510 Atherosclerotic heart disease of native coronary artery without angina pectoris: Secondary | ICD-10-CM | POA: Diagnosis not present

## 2019-01-15 DIAGNOSIS — I2583 Coronary atherosclerosis due to lipid rich plaque: Secondary | ICD-10-CM

## 2019-01-15 LAB — CBC WITH DIFFERENTIAL/PLATELET
Basophils Absolute: 0.1 10*3/uL (ref 0.0–0.1)
Basophils Relative: 0.8 % (ref 0.0–3.0)
Eosinophils Absolute: 0.2 10*3/uL (ref 0.0–0.7)
Eosinophils Relative: 1.6 % (ref 0.0–5.0)
HCT: 47.1 % (ref 39.0–52.0)
Hemoglobin: 16.1 g/dL (ref 13.0–17.0)
Lymphocytes Relative: 21.8 % (ref 12.0–46.0)
Lymphs Abs: 2.1 10*3/uL (ref 0.7–4.0)
MCHC: 34.2 g/dL (ref 30.0–36.0)
MCV: 94.2 fl (ref 78.0–100.0)
Monocytes Absolute: 0.7 10*3/uL (ref 0.1–1.0)
Monocytes Relative: 7.7 % (ref 3.0–12.0)
Neutro Abs: 6.5 10*3/uL (ref 1.4–7.7)
Neutrophils Relative %: 68.1 % (ref 43.0–77.0)
Platelets: 234 10*3/uL (ref 150.0–400.0)
RBC: 5 Mil/uL (ref 4.22–5.81)
RDW: 14 % (ref 11.5–15.5)
WBC: 9.6 10*3/uL (ref 4.0–10.5)

## 2019-01-15 LAB — BASIC METABOLIC PANEL
BUN: 16 mg/dL (ref 6–23)
CO2: 24 mEq/L (ref 19–32)
Calcium: 9.4 mg/dL (ref 8.4–10.5)
Chloride: 102 mEq/L (ref 96–112)
Creatinine, Ser: 0.99 mg/dL (ref 0.40–1.50)
GFR: 75.27 mL/min (ref >60.00)
Glucose, Bld: 83 mg/dL (ref 70–99)
Potassium: 4.4 mEq/L (ref 3.5–5.1)
Sodium: 135 mEq/L (ref 135–145)

## 2019-01-15 LAB — LIPID PANEL
Cholesterol: 218 mg/dL — ABNORMAL HIGH (ref 0–200)
HDL: 61.6 mg/dL (ref >39.00)
LDL Cholesterol: 123 mg/dL — ABNORMAL HIGH (ref 0–99)
NonHDL: 156.66
Total CHOL/HDL Ratio: 4
Triglycerides: 166 mg/dL — ABNORMAL HIGH (ref 0.0–149.0)
VLDL: 33.2 mg/dL (ref 0.0–40.0)

## 2019-01-15 LAB — URINALYSIS, ROUTINE W REFLEX MICROSCOPIC
Bilirubin Urine: NEGATIVE
Hgb urine dipstick: NEGATIVE
Ketones, ur: NEGATIVE
Nitrite: NEGATIVE
RBC / HPF: NONE SEEN (ref 0–?)
Specific Gravity, Urine: 1.015 (ref 1.000–1.030)
Total Protein, Urine: NEGATIVE
Urine Glucose: NEGATIVE
Urobilinogen, UA: 0.2 (ref 0.0–1.0)
pH: 7 (ref 5.0–8.0)

## 2019-01-15 LAB — URINALYSIS
Bilirubin Urine: NEGATIVE
Hgb urine dipstick: NEGATIVE
Ketones, ur: NEGATIVE
Nitrite: NEGATIVE
Specific Gravity, Urine: 1.015 (ref 1.000–1.030)
Total Protein, Urine: NEGATIVE
Urine Glucose: NEGATIVE
Urobilinogen, UA: 0.2 (ref 0.0–1.0)
pH: 7 (ref 5.0–8.0)

## 2019-01-15 LAB — HEPATIC FUNCTION PANEL
ALT: 13 U/L (ref 0–53)
AST: 15 U/L (ref 0–37)
Albumin: 4.3 g/dL (ref 3.5–5.2)
Alkaline Phosphatase: 87 U/L (ref 39–117)
Bilirubin, Direct: 0.1 mg/dL (ref 0.0–0.3)
Total Bilirubin: 0.6 mg/dL (ref 0.2–1.2)
Total Protein: 7.5 g/dL (ref 6.0–8.3)

## 2019-01-15 LAB — TSH: TSH: 1.5 u[IU]/mL (ref 0.35–4.50)

## 2019-01-15 LAB — PSA: PSA: 0.9 ng/mL (ref 0.10–4.00)

## 2019-01-15 MED ORDER — FLUTICASONE PROPIONATE 50 MCG/ACT NA SUSP: 1.0000 | Freq: Every day | NASAL | 3 refills | Status: DC | PRN

## 2019-01-15 MED ORDER — ROSUVASTATIN CALCIUM 5 MG PO TABS: ORAL_TABLET | ORAL | 3 refills | Status: DC

## 2019-01-15 NOTE — Assessment & Plan Note (Signed)
Crestor weekly if tol, ASA

## 2019-01-15 NOTE — Assessment & Plan Note (Signed)
We discussed age appropriate health related issues, including available/recomended screening tests and vaccinations. We discussed a need for adhering to healthy diet and exercise. Labs were ordered to be later reviewed . All questions were answered. Colon due 2021 

## 2019-01-15 NOTE — Assessment & Plan Note (Signed)
CXR

## 2019-01-15 NOTE — Assessment & Plan Note (Signed)
Zolpidem prn 

## 2019-01-15 NOTE — Progress Notes (Signed)
Subjective:  Patient ID: Raymond Snow, male    DOB: 09-16-1950  Age: 68 y.o. MRN: [id number]  CC: No chief complaint on file.   HPI ELIAZER HEMPHILL presents for a well exam F/u abn chest CT, HTN, insomnia  Outpatient Medications Prior to Visit  Medication Sig Dispense Refill  . amLODipine (NORVASC) 10 MG tablet TAKE (1) TABLET DAILY IN THE MORNING. 90 tablet 3  . aspirin 81 MG chewable tablet Chew 81 mg by mouth daily.      . fluticasone (FLONASE) 50 MCG/ACT nasal spray Place 1-2 sprays into both nostrils daily as needed for rhinitis. 48 g 3  . irbesartan (AVAPRO) 300 MG tablet Take 1 tablet (300 mg total) by mouth daily. 90 tablet 3  . valACYclovir (VALTREX) 1000 MG tablet TAKE (1/2) TABLET TWICE DAILY. 90 tablet 3  . zolpidem (AMBIEN) 10 MG tablet TAKE 1 TABLET BY MOUTH AT BEDTIME IF NEEDED FOR SLEEP. 90 tablet 1   No facility-administered medications prior to visit.     ROS: Review of Systems  Constitutional: Negative for appetite change, fatigue and unexpected weight change.  HENT: Positive for congestion and rhinorrhea. Negative for nosebleeds, sneezing, sore throat and trouble swallowing.   Eyes: Negative for itching and visual disturbance.  Respiratory: Negative for cough, chest tightness, shortness of breath and wheezing.   Cardiovascular: Negative for chest pain, palpitations and leg swelling.  Gastrointestinal: Negative for abdominal distention, blood in stool, diarrhea and nausea.  Genitourinary: Negative for frequency and hematuria.  Musculoskeletal: Negative for back pain, gait problem, joint swelling and neck pain.  Skin: Negative for rash.  Neurological: Negative for dizziness, tremors, speech difficulty and weakness.  Psychiatric/Behavioral: Negative for agitation, dysphoric mood, sleep disturbance and suicidal ideas. The patient is not nervous/anxious.     Objective:  BP 120/72 (BP Location: Left Arm, Patient Position: Sitting, Cuff Size: Normal)   Pulse 82    Temp 98 F (36.7 C) (Oral)   Ht 5\' 8"  (1.727 m)   Wt 207 lb (93.9 kg)   SpO2 97%   BMI 31.47 kg/m   BP Readings from Last 3 Encounters:  01/15/19 120/72  02/06/18 118/74  12/19/17 110/72    Wt Readings from Last 3 Encounters:  01/15/19 207 lb (93.9 kg)  02/06/18 205 lb 9.6 oz (93.3 kg)  12/19/17 207 lb (93.9 kg)    Physical Exam Constitutional:      General: He is not in acute distress.    Appearance: He is well-developed.     Comments: NAD  Eyes:     Conjunctiva/sclera: Conjunctivae normal.     Pupils: Pupils are equal, round, and reactive to light.  Neck:     Musculoskeletal: Normal range of motion.     Thyroid: No thyromegaly.     Vascular: No JVD.  Cardiovascular:     Rate and Rhythm: Normal rate and regular rhythm.     Heart sounds: Normal heart sounds. No murmur. No friction rub. No gallop.   Pulmonary:     Effort: Pulmonary effort is normal. No respiratory distress.     Breath sounds: Normal breath sounds. No wheezing or rales.  Chest:     Chest wall: No tenderness.  Abdominal:     General: Bowel sounds are normal. There is no distension.     Palpations: Abdomen is soft. There is no mass.     Tenderness: There is no abdominal tenderness. There is no guarding or rebound.  Genitourinary:    Rectum:  Normal. Guaiac result negative.  Musculoskeletal: Normal range of motion.        General: No tenderness.  Lymphadenopathy:     Cervical: No cervical adenopathy.  Skin:    General: Skin is warm and dry.     Findings: No rash.  Neurological:     Mental Status: He is alert and oriented to person, place, and time.     Cranial Nerves: No cranial nerve deficit.     Motor: No abnormal muscle tone.     Coordination: Coordination normal.     Gait: Gait normal.     Deep Tendon Reflexes: Reflexes are normal and symmetric.  Psychiatric:        Behavior: Behavior normal.        Thought Content: Thought content normal.        Judgment: Judgment normal.   prostate  1+  Lab Results  Component Value Date   WBC 6.8 12/19/2017   HGB 16.2 12/19/2017   HCT 48.5 12/19/2017   PLT 254.0 12/19/2017   GLUCOSE 93 12/19/2017   CHOL 201 (H) 12/19/2017   TRIG 61.0 12/19/2017   HDL 70.60 12/19/2017   LDLDIRECT 169.7 06/01/2013   LDLCALC 118 (H) 12/19/2017   ALT 15 12/19/2017   AST 17 12/19/2017   NA 140 12/19/2017   K 4.5 12/19/2017   CL 103 12/19/2017   CREATININE 1.14 12/19/2017   BUN 12 12/19/2017   CO2 29 12/19/2017   TSH 1.41 12/19/2017   PSA 0.82 12/19/2017   INR 0.97 11/22/2012    Dg Chest 2 View  Result Date: 02/06/2018 CLINICAL DATA:  Follow-up changes on prior CT EXAM: CHEST - 2 VIEW COMPARISON:  12/24/2017 CT of the chest FINDINGS: Cardiac shadow is within normal limits. Previously seen infiltrative change in the right upper lobe has resolved in the interval. No sizable effusion is seen. No bony abnormality is noted. IMPRESSION: Resolution of previously seen right upper lobe infiltrate. Electronically Signed   By: Inez Catalina M.D.   On: 02/06/2018 12:11    Assessment & Plan:   There are no diagnoses linked to this encounter.   No orders of the defined types were placed in this encounter.    Follow-up: No follow-ups on file.  Walker Kehr, MD

## 2019-01-23 ENCOUNTER — Other Ambulatory Visit: Payer: Self-pay

## 2019-01-23 MED ORDER — ROSUVASTATIN CALCIUM 5 MG PO TABS: 5.0000 mg | ORAL_TABLET | Freq: Every day | ORAL | 3 refills | Status: DC

## 2019-02-18 ENCOUNTER — Encounter: Payer: 59 | Admitting: Internal Medicine

## 2019-02-19 ENCOUNTER — Encounter: Payer: 59 | Admitting: Internal Medicine

## 2019-04-04 IMAGING — DX DG CHEST 2V
2 series · 2 of 2 positions shown · non-contrast
Comparison: PA and lateral chest x-ray November 22, 2012

CLINICAL DATA: Routine examination. Former smoker. History of
hypertension.

EXAM:
CHEST - 2 VIEW

[chest pa]
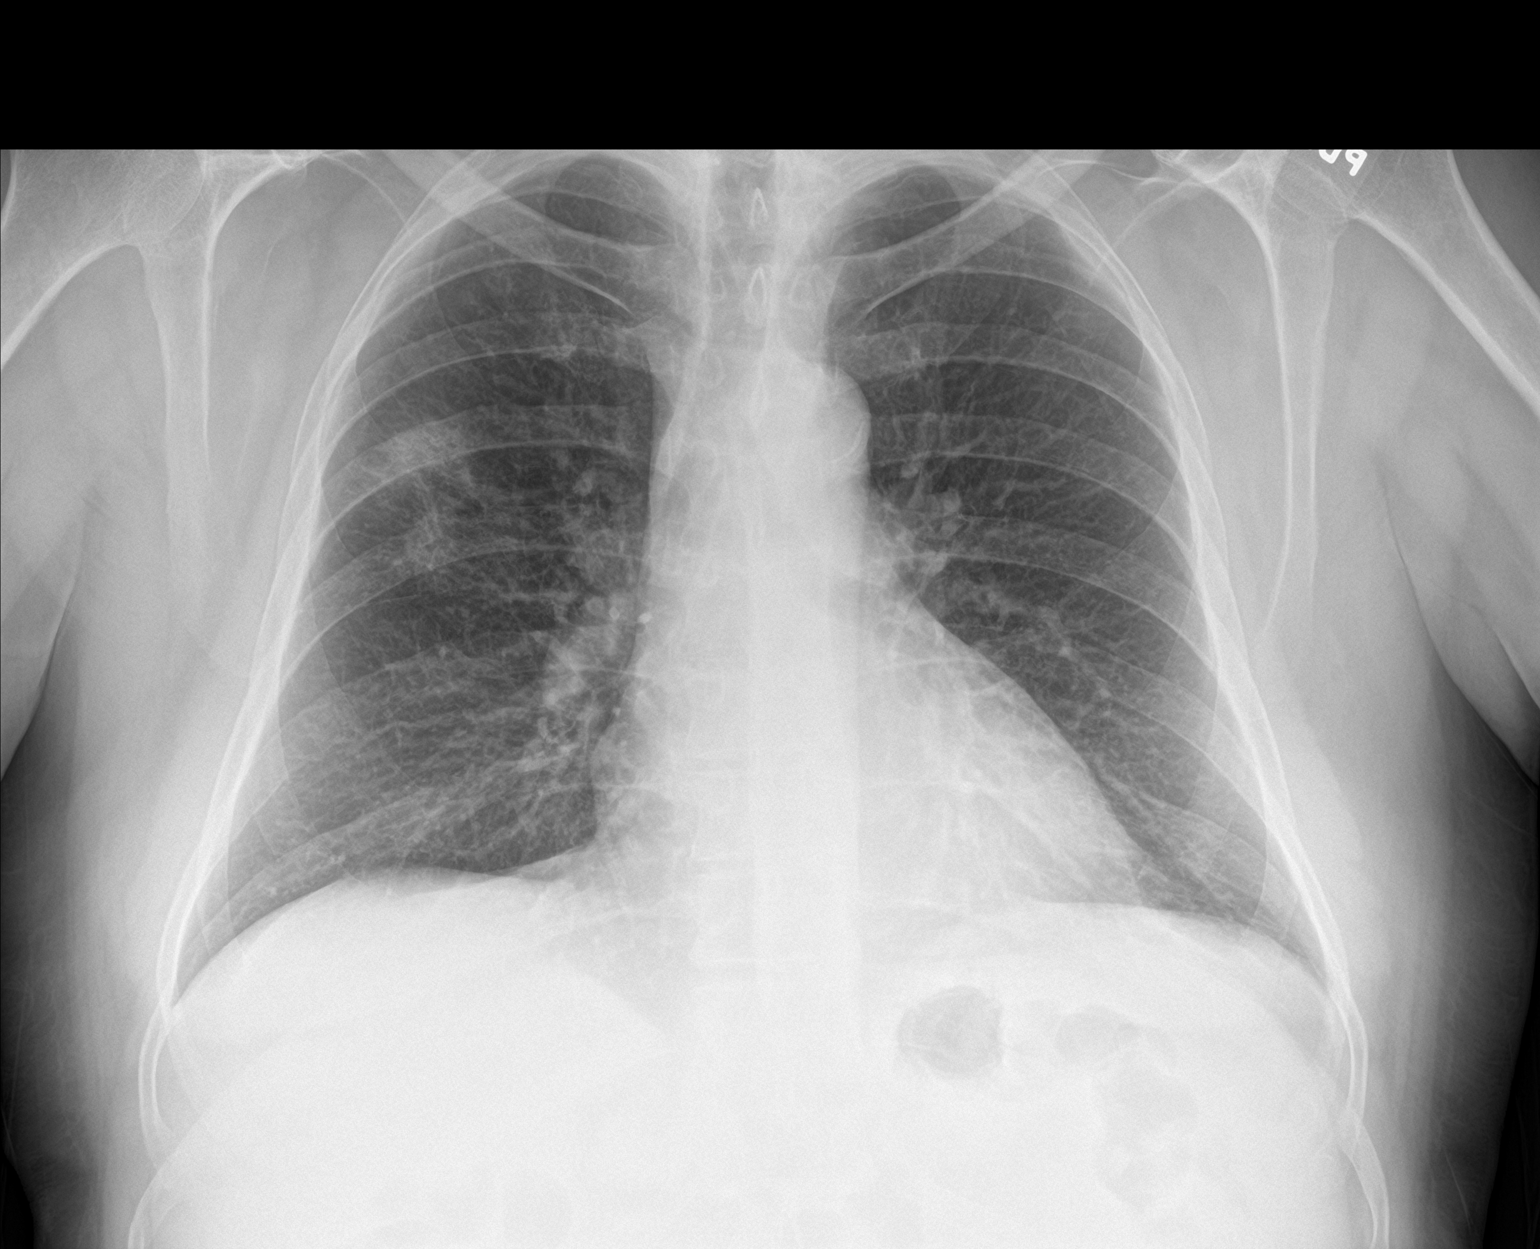

[chest lat]
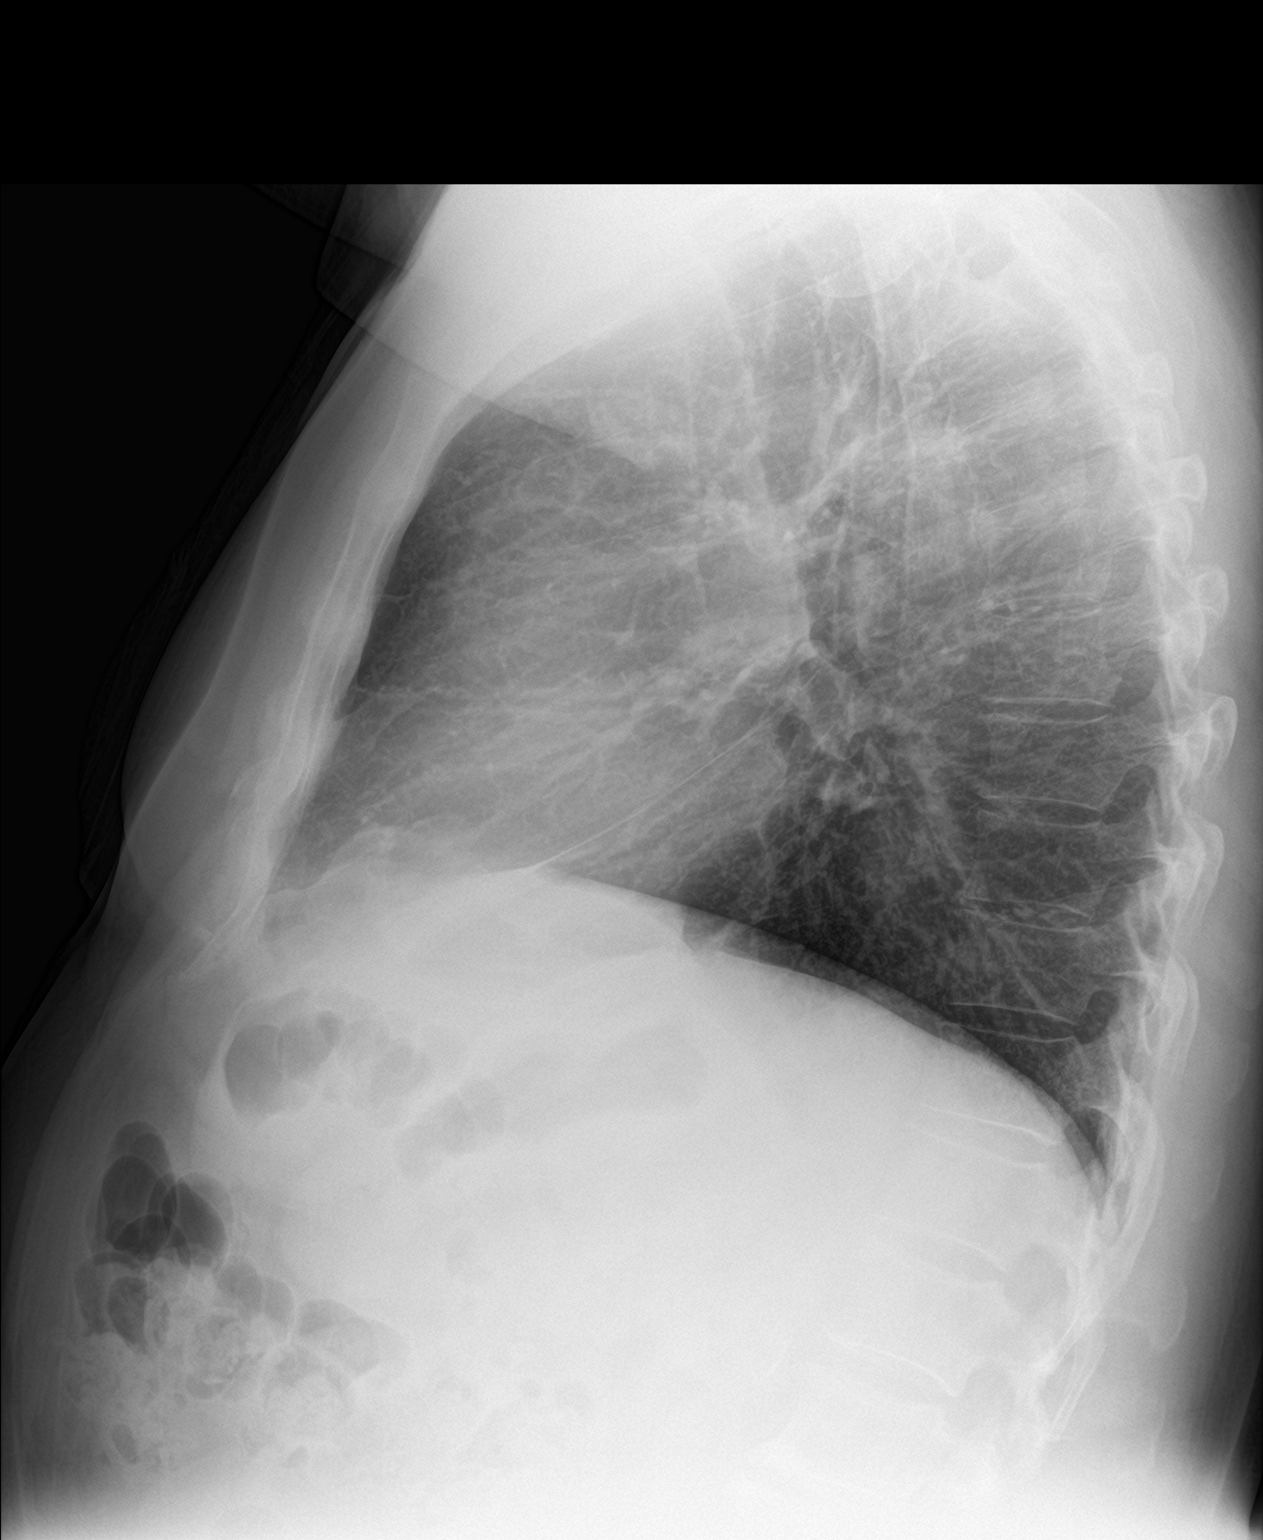

[2 of 2 positions shown; findings below may reference images not displayed]

FINDINGS: There is new abnormal airspace opacity in the right upper lobe. The
left lung is clear. There is no pleural effusion. The heart and
pulmonary vascularity are normal. There is calcification in the wall
of the aortic arch.
IMPRESSION: New increased density in the right upper lobe. This may reflect
pneumonia but a parenchymal mass either benign or malignant is not
excluded. Given the patient's asymptomatic state and the previous
smoking history, chest CT scanning now is recommended. These results
will be called to the ordering clinician or representative by the
Radiologist Assistant, and communication documented in the PACS or
zVision Dashboard.

## 2019-04-18 ENCOUNTER — Ambulatory Visit (INDEPENDENT_AMBULATORY_CARE_PROVIDER_SITE_OTHER): Payer: Medicare Other

## 2019-04-18 ENCOUNTER — Other Ambulatory Visit: Payer: Self-pay

## 2019-04-18 DIAGNOSIS — Z23 Encounter for immunization: Secondary | ICD-10-CM | POA: Diagnosis not present

## 2019-04-20 ENCOUNTER — Telehealth: Payer: Self-pay | Admitting: Internal Medicine

## 2019-04-20 NOTE — Telephone Encounter (Signed)
Patient is calling to speak with Tammy regarding a bill issue with Medicare. He wanted to update her. Please advise [phone]- [phone]

## 2019-04-22 NOTE — Telephone Encounter (Signed)
Spoke with in regard

## 2019-06-01 ENCOUNTER — Other Ambulatory Visit: Payer: Self-pay | Admitting: Internal Medicine

## 2019-06-01 NOTE — Telephone Encounter (Signed)
[place] Controlled Database Checked Last filled: 03/05/19 # 90 LOV w/you: 01/15/19 Next appt w/you: 07/20/19

## 2019-07-20 ENCOUNTER — Ambulatory Visit (INDEPENDENT_AMBULATORY_CARE_PROVIDER_SITE_OTHER): Payer: Medicare Other | Admitting: Internal Medicine

## 2019-07-20 ENCOUNTER — Other Ambulatory Visit (INDEPENDENT_AMBULATORY_CARE_PROVIDER_SITE_OTHER): Payer: Medicare Other

## 2019-07-20 ENCOUNTER — Encounter: Payer: Self-pay | Admitting: Internal Medicine

## 2019-07-20 ENCOUNTER — Other Ambulatory Visit: Payer: Self-pay

## 2019-07-20 VITALS — BP 130/78 | HR 74 | Temp 97.9°F | Ht 68.0 in | Wt 205.0 lb

## 2019-07-20 DIAGNOSIS — I10 Essential (primary) hypertension: Secondary | ICD-10-CM

## 2019-07-20 DIAGNOSIS — I2583 Coronary atherosclerosis due to lipid rich plaque: Secondary | ICD-10-CM

## 2019-07-20 DIAGNOSIS — I2510 Atherosclerotic heart disease of native coronary artery without angina pectoris: Secondary | ICD-10-CM | POA: Diagnosis not present

## 2019-07-20 DIAGNOSIS — E785 Hyperlipidemia, unspecified: Secondary | ICD-10-CM

## 2019-07-20 DIAGNOSIS — Z23 Encounter for immunization: Secondary | ICD-10-CM

## 2019-07-20 LAB — BASIC METABOLIC PANEL
BUN: 16 mg/dL (ref 6–23)
CO2: 25 mEq/L (ref 19–32)
Calcium: 9.6 mg/dL (ref 8.4–10.5)
Chloride: 105 mEq/L (ref 96–112)
Creatinine, Ser: 0.96 mg/dL (ref 0.40–1.50)
GFR: 77.87 mL/min (ref >60.00)
Glucose, Bld: 93 mg/dL (ref 70–99)
Potassium: 4.1 mEq/L (ref 3.5–5.1)
Sodium: 138 mEq/L (ref 135–145)

## 2019-07-20 LAB — HEPATIC FUNCTION PANEL
ALT: 15 U/L (ref 0–53)
AST: 17 U/L (ref 0–37)
Albumin: 4.1 g/dL (ref 3.5–5.2)
Alkaline Phosphatase: 82 U/L (ref 39–117)
Bilirubin, Direct: 0.1 mg/dL (ref 0.0–0.3)
Total Bilirubin: 0.6 mg/dL (ref 0.2–1.2)
Total Protein: 7.3 g/dL (ref 6.0–8.3)

## 2019-07-20 LAB — LIPID PANEL
Cholesterol: 214 mg/dL — ABNORMAL HIGH (ref 0–200)
HDL: 55.9 mg/dL (ref >39.00)
LDL Cholesterol: 139 mg/dL — ABNORMAL HIGH (ref 0–99)
NonHDL: 158.43
Total CHOL/HDL Ratio: 4
Triglycerides: 98 mg/dL (ref 0.0–149.0)
VLDL: 19.6 mg/dL (ref 0.0–40.0)

## 2019-07-20 MED ORDER — ROSUVASTATIN CALCIUM 5 MG PO TABS: 5.0000 mg | ORAL_TABLET | Freq: Every day | ORAL | 3 refills | Status: DC

## 2019-07-20 MED ORDER — AMLODIPINE BESYLATE 10 MG PO TABS: ORAL_TABLET | ORAL | 3 refills | Status: DC

## 2019-07-20 MED ORDER — FLUTICASONE PROPIONATE 50 MCG/ACT NA SUSP: 1.0000 | Freq: Every day | NASAL | 3 refills | Status: DC | PRN

## 2019-07-20 MED ORDER — VALACYCLOVIR HCL 1 G PO TABS: ORAL_TABLET | ORAL | 3 refills | Status: DC

## 2019-07-20 MED ORDER — ZOLPIDEM TARTRATE 10 MG PO TABS: ORAL_TABLET | ORAL | 1 refills | Status: DC

## 2019-07-20 MED ORDER — IRBESARTAN 300 MG PO TABS: 300.0000 mg | ORAL_TABLET | Freq: Every day | ORAL | 3 refills | Status: DC

## 2019-07-20 NOTE — Patient Instructions (Addendum)
Sign up for [employer] ( via [employer] on your phone or your ipad). If you don't have a Art therapist card  - go to [employer] branch. They will set you up in 15 minutes. It is free. You can check out books to read and to listen, check out magazines and newspapers, movies etc.  If you have medicare related insurance (such as traditional Medicare, [employer], [employer], or similar), Please make an appointment at the scheduling desk with Sharee Pimple, the [employer], for your Wellness visit in this office, which is a benefit with your insurance.

## 2019-07-20 NOTE — Assessment & Plan Note (Signed)
Norvasc Valsartan

## 2019-07-20 NOTE — Assessment & Plan Note (Signed)
Crestor weekly if tol, ASA

## 2019-07-20 NOTE — Addendum Note (Signed)
Addended by: Karren Cobble on: 07/20/2019 10:47 AM   Modules accepted: Orders

## 2019-07-20 NOTE — Progress Notes (Signed)
Subjective:  Patient ID: Raymond Snow, male    DOB: [birth year]  Age: 68 y.o. MRN: [id number]  CC: No chief complaint on file.   HPI Raymond Snow presents for insomnia, dyslipidemia, HTN f/u  Outpatient Medications Prior to Visit  Medication Sig Dispense Refill  . amLODipine (NORVASC) 10 MG tablet TAKE (1) TABLET DAILY IN THE MORNING. 90 tablet 3  . aspirin 81 MG chewable tablet Chew 81 mg by mouth daily.      . fluticasone (FLONASE) 50 MCG/ACT nasal spray Place 1-2 sprays into both nostrils daily as needed for rhinitis. 48 g 3  . irbesartan (AVAPRO) 300 MG tablet Take 1 tablet (300 mg total) by mouth daily. 90 tablet 3  . rosuvastatin (CRESTOR) 5 MG tablet Take 1 tablet (5 mg total) by mouth daily. As directed 30 tablet 3  . valACYclovir (VALTREX) 1000 MG tablet TAKE (1/2) TABLET TWICE DAILY. 90 tablet 3  . zolpidem (AMBIEN) 10 MG tablet TAKE 1 TABLET BY MOUTH AT BEDTIME IF NEEDED FOR SLEEP. 90 tablet 1   No facility-administered medications prior to visit.     ROS: Review of Systems  Constitutional: Negative for appetite change, fatigue and unexpected weight change.  HENT: Negative for congestion, nosebleeds, sneezing, sore throat and trouble swallowing.   Eyes: Negative for itching and visual disturbance.  Respiratory: Negative for cough.   Cardiovascular: Negative for chest pain, palpitations and leg swelling.  Gastrointestinal: Negative for abdominal distention, blood in stool, diarrhea and nausea.  Genitourinary: Negative for frequency and hematuria.  Musculoskeletal: Negative for back pain, gait problem, joint swelling and neck pain.  Skin: Negative for rash.  Neurological: Negative for dizziness, tremors, speech difficulty and weakness.  Psychiatric/Behavioral: Negative for agitation, dysphoric mood and sleep disturbance. The patient is not nervous/anxious.     Objective:  BP 130/78 (BP Location: Left Arm, Patient Position: Sitting, Cuff Size: Normal)   Pulse 74    Temp 97.9 F (36.6 C) (Oral)   Ht 5\' 8"  (1.727 m)   Wt 205 lb (93 kg)   SpO2 97%   BMI 31.17 kg/m   BP Readings from Last 3 Encounters:  07/20/19 130/78  01/15/19 120/72  02/06/18 118/74    Wt Readings from Last 3 Encounters:  07/20/19 205 lb (93 kg)  01/15/19 207 lb (93.9 kg)  02/06/18 205 lb 9.6 oz (93.3 kg)    Physical Exam Constitutional:      General: He is not in acute distress.    Appearance: He is well-developed. He is obese.     Comments: NAD  Eyes:     Conjunctiva/sclera: Conjunctivae normal.     Pupils: Pupils are equal, round, and reactive to light.  Neck:     Musculoskeletal: Normal range of motion.     Thyroid: No thyromegaly.     Vascular: No JVD.  Cardiovascular:     Rate and Rhythm: Normal rate and regular rhythm.     Heart sounds: Normal heart sounds. No murmur. No friction rub. No gallop.   Pulmonary:     Effort: Pulmonary effort is normal. No respiratory distress.     Breath sounds: Normal breath sounds. No wheezing or rales.  Chest:     Chest wall: No tenderness.  Abdominal:     General: Bowel sounds are normal. There is no distension.     Palpations: Abdomen is soft. There is no mass.     Tenderness: There is no abdominal tenderness. There is no guarding or rebound.  Musculoskeletal: Normal range of motion.        General: No tenderness.  Lymphadenopathy:     Cervical: No cervical adenopathy.  Skin:    General: Skin is warm and dry.     Findings: No rash.  Neurological:     Mental Status: He is alert and oriented to person, place, and time.     Cranial Nerves: No cranial nerve deficit.     Motor: No abnormal muscle tone.     Coordination: Coordination normal.     Gait: Gait normal.     Deep Tendon Reflexes: Reflexes are normal and symmetric.  Psychiatric:        Behavior: Behavior normal.        Thought Content: Thought content normal.        Judgment: Judgment normal.     Lab Results  Component Value Date   WBC 9.6 01/15/2019    HGB 16.1 01/15/2019   HCT 47.1 01/15/2019   PLT 234.0 01/15/2019   GLUCOSE 83 01/15/2019   CHOL 218 (H) 01/15/2019   TRIG 166.0 (H) 01/15/2019   HDL 61.60 01/15/2019   LDLDIRECT 169.7 06/01/2013   LDLCALC 123 (H) 01/15/2019   ALT 13 01/15/2019   AST 15 01/15/2019   NA 135 01/15/2019   K 4.4 01/15/2019   CL 102 01/15/2019   CREATININE 0.99 01/15/2019   BUN 16 01/15/2019   CO2 24 01/15/2019   TSH 1.50 01/15/2019   PSA 0.90 01/15/2019   INR 0.97 11/22/2012    Dg Chest 2 View  Result Date: 01/15/2019 CLINICAL DATA:  Abnormal chest x-ray. EXAM: CHEST - 2 VIEW COMPARISON:  02/06/2018. FINDINGS: The heart size is stable from prior. The lungs are clear. The visualized skeletal structures are unremarkable. IMPRESSION: No active cardiopulmonary disease. Electronically Signed   By: Constance Holster M.D.   On: 01/15/2019 20:03    Assessment & Plan:   There are no diagnoses linked to this encounter.   No orders of the defined types were placed in this encounter.    Follow-up: No follow-ups on file.  Raymond Kehr, MD

## 2019-07-20 NOTE — Assessment & Plan Note (Signed)
Crestor Labs 

## 2019-09-19 ENCOUNTER — Ambulatory Visit: Payer: Medicare Other

## 2019-09-26 ENCOUNTER — Ambulatory Visit: Payer: Medicare Other | Attending: Internal Medicine

## 2019-09-26 DIAGNOSIS — Z23 Encounter for immunization: Secondary | ICD-10-CM | POA: Insufficient documentation

## 2019-09-26 NOTE — Progress Notes (Signed)
   Covid-19 Vaccination Clinic  Name:  Raymond Snow    MRN: [id number] DOB: 25-Nov-1950  09/26/2019  [name] was observed post Covid-19 immunization for 15 minutes without incidence. He was provided with Vaccine Information Sheet and instruction to access the V-Safe system.   [name] was instructed to call 911 with any severe reactions post vaccine: Marland Kitchen Difficulty breathing  . Swelling of your face and throat  . A fast heartbeat  . A bad rash all over your body  . Dizziness and weakness    Immunizations Administered    Name Date Dose VIS Date Route   Pfizer COVID-19 Vaccine 09/26/2019  5:28 PM 0.3 mL 07/31/2019 Intramuscular   Manufacturer: [place]   Lot: [id number]   [place]: [id number]

## 2019-09-30 ENCOUNTER — Ambulatory Visit: Payer: Medicare Other

## 2019-10-21 ENCOUNTER — Ambulatory Visit: Payer: Medicare Other | Attending: Internal Medicine

## 2019-10-21 DIAGNOSIS — Z23 Encounter for immunization: Secondary | ICD-10-CM

## 2019-10-21 NOTE — Progress Notes (Signed)
   Covid-19 Vaccination [facility]  Name:  Raymond Snow    MRN: [id number] DOB: 26-Apr-1951  10/21/2019  Raymond Snow was observed post Covid-19 immunization for 15 minutes without incident. He was provided with Vaccine Information Sheet and instruction to access the V-Safe system.   Raymond Snow was instructed to call 911 with any severe reactions post vaccine: Marland Kitchen Difficulty breathing  . Swelling of face and throat  . A fast heartbeat  . A bad rash all over body  . Dizziness and weakness   Immunizations Administered    Name Date Dose VIS Date Route   Pfizer COVID-19 Vaccine 10/21/2019  2:22 PM 0.3 mL 07/31/2019 Intramuscular   Manufacturer: [place]   Lot: [id number]   [place]: [id number]

## 2020-01-14 ENCOUNTER — Other Ambulatory Visit: Payer: Self-pay

## 2020-01-14 ENCOUNTER — Ambulatory Visit (INDEPENDENT_AMBULATORY_CARE_PROVIDER_SITE_OTHER): Payer: Medicare Other | Admitting: Internal Medicine

## 2020-01-14 ENCOUNTER — Encounter: Payer: Self-pay | Admitting: Internal Medicine

## 2020-01-14 ENCOUNTER — Ambulatory Visit (INDEPENDENT_AMBULATORY_CARE_PROVIDER_SITE_OTHER): Payer: Medicare Other

## 2020-01-14 VITALS — BP 130/80 | HR 74 | Temp 97.8°F | Ht 68.0 in | Wt 197.0 lb

## 2020-01-14 DIAGNOSIS — E785 Hyperlipidemia, unspecified: Secondary | ICD-10-CM | POA: Diagnosis not present

## 2020-01-14 DIAGNOSIS — Z1211 Encounter for screening for malignant neoplasm of colon: Secondary | ICD-10-CM

## 2020-01-14 DIAGNOSIS — M79601 Pain in right arm: Secondary | ICD-10-CM | POA: Insufficient documentation

## 2020-01-14 DIAGNOSIS — Z125 Encounter for screening for malignant neoplasm of prostate: Secondary | ICD-10-CM | POA: Diagnosis not present

## 2020-01-14 DIAGNOSIS — R918 Other nonspecific abnormal finding of lung field: Secondary | ICD-10-CM

## 2020-01-14 DIAGNOSIS — Z0000 Encounter for general adult medical examination without abnormal findings: Secondary | ICD-10-CM

## 2020-01-14 DIAGNOSIS — J301 Allergic rhinitis due to pollen: Secondary | ICD-10-CM | POA: Diagnosis not present

## 2020-01-14 DIAGNOSIS — I2510 Atherosclerotic heart disease of native coronary artery without angina pectoris: Secondary | ICD-10-CM

## 2020-01-14 DIAGNOSIS — J309 Allergic rhinitis, unspecified: Secondary | ICD-10-CM | POA: Insufficient documentation

## 2020-01-14 DIAGNOSIS — M791 Myalgia, unspecified site: Secondary | ICD-10-CM | POA: Diagnosis not present

## 2020-01-14 DIAGNOSIS — F5101 Primary insomnia: Secondary | ICD-10-CM

## 2020-01-14 DIAGNOSIS — I10 Essential (primary) hypertension: Secondary | ICD-10-CM

## 2020-01-14 LAB — BASIC METABOLIC PANEL
BUN: 13 mg/dL (ref 6–23)
CO2: 29 mEq/L (ref 19–32)
Calcium: 9.4 mg/dL (ref 8.4–10.5)
Chloride: 103 mEq/L (ref 96–112)
Creatinine, Ser: 0.95 mg/dL (ref 0.40–1.50)
GFR: 78.7 mL/min (ref >60.00)
Glucose, Bld: 90 mg/dL (ref 70–99)
Potassium: 4.3 mEq/L (ref 3.5–5.1)
Sodium: 137 mEq/L (ref 135–145)

## 2020-01-14 LAB — CBC WITH DIFFERENTIAL/PLATELET
Basophils Absolute: 0.1 10*3/uL (ref 0.0–0.1)
Basophils Relative: 1.2 % (ref 0.0–3.0)
Eosinophils Absolute: 0.2 10*3/uL (ref 0.0–0.7)
Eosinophils Relative: 2.5 % (ref 0.0–5.0)
HCT: 45.4 % (ref 39.0–52.0)
Hemoglobin: 15.1 g/dL (ref 13.0–17.0)
Lymphocytes Relative: 25.6 % (ref 12.0–46.0)
Lymphs Abs: 2 10*3/uL (ref 0.7–4.0)
MCHC: 33.3 g/dL (ref 30.0–36.0)
MCV: 95.4 fl (ref 78.0–100.0)
Monocytes Absolute: 0.6 10*3/uL (ref 0.1–1.0)
Monocytes Relative: 7.7 % (ref 3.0–12.0)
Neutro Abs: 4.8 10*3/uL (ref 1.4–7.7)
Neutrophils Relative %: 63 % (ref 43.0–77.0)
Platelets: 210 10*3/uL (ref 150.0–400.0)
RBC: 4.76 Mil/uL (ref 4.22–5.81)
RDW: 14.3 % (ref 11.5–15.5)
WBC: 7.6 10*3/uL (ref 4.0–10.5)

## 2020-01-14 LAB — HEPATIC FUNCTION PANEL
ALT: 13 U/L (ref 0–53)
AST: 18 U/L (ref 0–37)
Albumin: 4.4 g/dL (ref 3.5–5.2)
Alkaline Phosphatase: 77 U/L (ref 39–117)
Bilirubin, Direct: 0.1 mg/dL (ref 0.0–0.3)
Total Bilirubin: 0.8 mg/dL (ref 0.2–1.2)
Total Protein: 7.4 g/dL (ref 6.0–8.3)

## 2020-01-14 LAB — URINALYSIS
Bilirubin Urine: NEGATIVE
Hgb urine dipstick: NEGATIVE
Ketones, ur: NEGATIVE
Leukocytes,Ua: NEGATIVE
Nitrite: NEGATIVE
Specific Gravity, Urine: 1.02 (ref 1.000–1.030)
Total Protein, Urine: NEGATIVE
Urine Glucose: NEGATIVE
Urobilinogen, UA: 0.2 (ref 0.0–1.0)
pH: 7 (ref 5.0–8.0)

## 2020-01-14 LAB — LIPID PANEL
Cholesterol: 169 mg/dL (ref 0–200)
HDL: 74.7 mg/dL (ref >39.00)
LDL Cholesterol: 75 mg/dL (ref 0–99)
NonHDL: 93.96
Total CHOL/HDL Ratio: 2
Triglycerides: 93 mg/dL (ref 0.0–149.0)
VLDL: 18.6 mg/dL (ref 0.0–40.0)

## 2020-01-14 LAB — TSH: TSH: 1.02 u[IU]/mL (ref 0.35–4.50)

## 2020-01-14 LAB — PSA: PSA: 0.96 ng/mL (ref 0.10–4.00)

## 2020-01-14 MED ORDER — AMLODIPINE BESYLATE 10 MG PO TABS: ORAL_TABLET | ORAL | 3 refills | Status: DC

## 2020-01-14 MED ORDER — MONTELUKAST SODIUM 10 MG PO TABS
10.0000 mg | ORAL_TABLET | Freq: Every day | ORAL | 3 refills | Status: DC
Start: 2020-01-14 — End: 2020-07-18

## 2020-01-14 MED ORDER — ZOLPIDEM TARTRATE 10 MG PO TABS: ORAL_TABLET | ORAL | 1 refills | Status: DC

## 2020-01-14 MED ORDER — VALACYCLOVIR HCL 1 G PO TABS: ORAL_TABLET | ORAL | 3 refills | Status: DC

## 2020-01-14 MED ORDER — IRBESARTAN 300 MG PO TABS: 300.0000 mg | ORAL_TABLET | Freq: Every day | ORAL | 3 refills | Status: DC

## 2020-01-14 MED ORDER — METHYLPREDNISOLONE ACETATE 80 MG/ML IJ SUSP
80.0000 mg | Freq: Once | INTRAMUSCULAR | Status: AC
  Administered 2020-01-14: 80 mg via INTRAMUSCULAR

## 2020-01-14 MED ORDER — ROSUVASTATIN CALCIUM 5 MG PO TABS: 5.0000 mg | ORAL_TABLET | Freq: Every day | ORAL | 3 refills | Status: DC

## 2020-01-14 MED ORDER — FLUTICASONE PROPIONATE 50 MCG/ACT NA SUSP: 1.0000 | Freq: Every day | NASAL | 3 refills | Status: DC | PRN

## 2020-01-14 NOTE — Assessment & Plan Note (Signed)
Worse Claritin and Flonase Add  Singulair Depo-medrol 80 mg IM

## 2020-01-14 NOTE — Assessment & Plan Note (Addendum)
Tolerating Crestor OK

## 2020-01-14 NOTE — Assessment & Plan Note (Signed)
Norvasc Valsartan

## 2020-01-14 NOTE — Assessment & Plan Note (Signed)
Tolerating Crestor OK

## 2020-01-14 NOTE — Progress Notes (Signed)
Subjective:  Patient ID: Raymond Snow, male    DOB: [birth year]  Age: 69 y.o. MRN: [id number]  CC: No chief complaint on file.   HPI Raymond Snow presents for a well exam.  He is complaining of worsening allergies and congestion.  Is complaining of right shoulder pain from working on the boat.  Follow-up hypertension, insomnia, coronary disease  Outpatient Medications Prior to Visit  Medication Sig Dispense Refill  . amLODipine (NORVASC) 10 MG tablet TAKE (1) TABLET DAILY IN THE MORNING. 90 tablet 3  . aspirin 81 MG chewable tablet Chew 81 mg by mouth daily.      . fluticasone (FLONASE) 50 MCG/ACT nasal spray Place 1-2 sprays into both nostrils daily as needed for rhinitis. 48 g 3  . irbesartan (AVAPRO) 300 MG tablet Take 1 tablet (300 mg total) by mouth daily. 90 tablet 3  . rosuvastatin (CRESTOR) 5 MG tablet Take 1 tablet (5 mg total) by mouth daily. As directed 90 tablet 3  . valACYclovir (VALTREX) 1000 MG tablet TAKE (1/2) TABLET TWICE DAILY. 90 tablet 3  . zolpidem (AMBIEN) 10 MG tablet 1 po qhs prn 90 tablet 1   No facility-administered medications prior to visit.    ROS: Review of Systems  Constitutional: Negative for appetite change, fatigue and unexpected weight change.  HENT: Positive for congestion. Negative for nosebleeds, sneezing, sore throat and trouble swallowing.   Eyes: Negative for itching and visual disturbance.  Respiratory: Negative for cough.   Cardiovascular: Negative for chest pain, palpitations and leg swelling.  Gastrointestinal: Negative for abdominal distention, blood in stool, diarrhea and nausea.  Genitourinary: Negative for frequency and hematuria.  Musculoskeletal: Negative for back pain, gait problem, joint swelling and neck pain.  Skin: Negative for rash.  Neurological: Negative for dizziness, tremors, speech difficulty and weakness.  Psychiatric/Behavioral: Negative for agitation, dysphoric mood, sleep disturbance and suicidal ideas. The  patient is not nervous/anxious.     Objective:  BP 130/80 (BP Location: Left Arm, Patient Position: Sitting, Cuff Size: Large)   Pulse 74   Temp 97.8 F (36.6 C) (Oral)   Ht 5\' 8"  (1.727 m)   Wt 197 lb (89.4 kg)   SpO2 97%   BMI 29.95 kg/m   BP Readings from Last 3 Encounters:  01/14/20 130/80  07/20/19 130/78  01/15/19 120/72    Wt Readings from Last 3 Encounters:  01/14/20 197 lb (89.4 kg)  07/20/19 205 lb (93 kg)  01/15/19 207 lb (93.9 kg)    Physical Exam Constitutional:      General: He is not in acute distress.    Appearance: He is well-developed.     Comments: NAD  Eyes:     Conjunctiva/sclera: Conjunctivae normal.     Pupils: Pupils are equal, round, and reactive to light.  Neck:     Thyroid: No thyromegaly.     Vascular: No JVD.  Cardiovascular:     Rate and Rhythm: Normal rate and regular rhythm.     Heart sounds: Normal heart sounds. No murmur. No friction rub. No gallop.   Pulmonary:     Effort: Pulmonary effort is normal. No respiratory distress.     Breath sounds: Normal breath sounds. No wheezing or rales.  Chest:     Chest wall: No tenderness.  Abdominal:     General: Bowel sounds are normal. There is no distension.     Palpations: Abdomen is soft. There is no mass.     Tenderness: There is no  abdominal tenderness. There is no guarding or rebound.  Musculoskeletal:        General: No tenderness. Normal range of motion.     Cervical back: Normal range of motion.  Lymphadenopathy:     Cervical: No cervical adenopathy.  Skin:    General: Skin is warm and dry.     Findings: No rash.  Neurological:     Mental Status: He is alert and oriented to person, place, and time.     Cranial Nerves: No cranial nerve deficit.     Motor: No abnormal muscle tone.     Coordination: Coordination normal.     Gait: Gait normal.     Deep Tendon Reflexes: Reflexes are normal and symmetric.  Psychiatric:        Behavior: Behavior normal.        Thought Content:  Thought content normal.        Judgment: Judgment normal.     Lab Results  Component Value Date   WBC 9.6 01/15/2019   HGB 16.1 01/15/2019   HCT 47.1 01/15/2019   PLT 234.0 01/15/2019   GLUCOSE 93 07/20/2019   CHOL 214 (H) 07/20/2019   TRIG 98.0 07/20/2019   HDL 55.90 07/20/2019   LDLDIRECT 169.7 06/01/2013   LDLCALC 139 (H) 07/20/2019   ALT 15 07/20/2019   AST 17 07/20/2019   NA 138 07/20/2019   K 4.1 07/20/2019   CL 105 07/20/2019   CREATININE 0.96 07/20/2019   BUN 16 07/20/2019   CO2 25 07/20/2019   TSH 1.50 01/15/2019   PSA 0.90 01/15/2019   INR 0.97 11/22/2012    DG Chest 2 View  Result Date: 01/15/2019 CLINICAL DATA:  Abnormal chest x-ray. EXAM: CHEST - 2 VIEW COMPARISON:  02/06/2018. FINDINGS: The heart size is stable from prior. The lungs are clear. The visualized skeletal structures are unremarkable. IMPRESSION: No active cardiopulmonary disease. Electronically Signed   By: Constance Holster M.D.   On: 01/15/2019 20:03    Assessment & Plan:    Walker Kehr, MD

## 2020-01-14 NOTE — Assessment & Plan Note (Signed)
CXR

## 2020-01-14 NOTE — Assessment & Plan Note (Signed)
Overuse - MSK Depo-medrol 80 mg IM

## 2020-01-14 NOTE — Assessment & Plan Note (Signed)
2020  lipids are up, however, it is the best we can do with his relative statin drug intolerance. We can refer you to Cardiology to discuss Repatha injections to lower lipids

## 2020-01-14 NOTE — Assessment & Plan Note (Signed)
On Zolpidem  Potential benefits of a long term benzodiazepines  use as well as potential risks  and complications were explained to the patient and were aknowledged. 

## 2020-01-15 ENCOUNTER — Telehealth: Payer: Self-pay

## 2020-01-15 DIAGNOSIS — R918 Other nonspecific abnormal finding of lung field: Secondary | ICD-10-CM

## 2020-01-15 NOTE — Addendum Note (Signed)
Addended by: Cassandria Anger on: 01/15/2020 02:21 PM   Modules accepted: Orders

## 2020-01-15 NOTE — Telephone Encounter (Signed)
Noted.  CT ordered.  Thanks

## 2020-01-15 NOTE — Telephone Encounter (Signed)
Pam from [facility] radiology called to give results from chest x ray on 5/27.  A probable 37mm nodule noted right mid lung zone & a chest CT is recommended.

## 2020-01-18 NOTE — Progress Notes (Signed)
Patient ID: Raymond Snow, male    DOB: 11-28-1950  Age: 69 y.o. MRN: [id number]  CC: No chief complaint on file.   HPI Raymond Snow presents for a well exam C/o congestion - worse Back on Crestor C/o R arm pain from working on his boat a lot  Outpatient Medications Prior to Visit  Medication Sig Dispense Refill  . amLODipine (NORVASC) 10 MG tablet TAKE (1) TABLET DAILY IN THE MORNING. 90 tablet 3  . aspirin 81 MG chewable tablet Chew 81 mg by mouth daily.      . fluticasone (FLONASE) 50 MCG/ACT nasal spray Place 1-2 sprays into both nostrils daily as needed for rhinitis. 48 g 3  . irbesartan (AVAPRO) 300 MG tablet Take 1 tablet (300 mg total) by mouth daily. 90 tablet 3  . rosuvastatin (CRESTOR) 5 MG tablet Take 1 tablet (5 mg total) by mouth daily. As directed 90 tablet 3  . valACYclovir (VALTREX) 1000 MG tablet TAKE (1/2) TABLET TWICE DAILY. 90 tablet 3  . zolpidem (AMBIEN) 10 MG tablet 1 po qhs prn 90 tablet 1   No facility-administered medications prior to visit.    ROS: Review of Systems  Constitutional: Negative for appetite change, fatigue and unexpected weight change.  HENT: Positive for congestion. Negative for nosebleeds, sneezing, sore throat and trouble swallowing.   Eyes: Negative for itching and visual disturbance.  Respiratory: Negative for cough.   Cardiovascular: Negative for chest pain, palpitations and leg swelling.  Gastrointestinal: Negative for abdominal distention, blood in stool, diarrhea and nausea.  Genitourinary: Negative for frequency and hematuria.  Musculoskeletal: Negative for back pain, gait problem, joint swelling and neck pain.  Skin: Negative for rash.  Neurological: Negative for dizziness, tremors, speech difficulty and weakness.  Psychiatric/Behavioral: Negative for agitation, dysphoric mood, sleep disturbance and suicidal ideas. The patient is not nervous/anxious.     Objective:  BP 130/80 (BP Location: Left Arm, Patient Position:  Sitting, Cuff Size: Large)   Pulse 74   Temp 97.8 F (36.6 C) (Oral)   Ht 5\' 8"  (1.727 m)   Wt 197 lb (89.4 kg)   SpO2 97%   BMI 29.95 kg/m   BP Readings from Last 3 Encounters:  01/14/20 130/80  07/20/19 130/78  01/15/19 120/72    Wt Readings from Last 3 Encounters:  01/14/20 197 lb (89.4 kg)  07/20/19 205 lb (93 kg)  01/15/19 207 lb (93.9 kg)    Physical Exam Constitutional:      General: He is not in acute distress.    Appearance: He is well-developed.     Comments: NAD  Eyes:     Conjunctiva/sclera: Conjunctivae normal.     Pupils: Pupils are equal, round, and reactive to light.  Neck:     Thyroid: No thyromegaly.     Vascular: No JVD.  Cardiovascular:     Rate and Rhythm: Normal rate and regular rhythm.     Heart sounds: Normal heart sounds. No murmur. No friction rub. No gallop.   Pulmonary:     Effort: Pulmonary effort is normal. No respiratory distress.     Breath sounds: Normal breath sounds. No wheezing or rales.  Chest:     Chest wall: No tenderness.  Abdominal:     General: Bowel sounds are normal. There is no distension.     Palpations: Abdomen is soft. There is no mass.     Tenderness: There is no abdominal tenderness. There is no guarding or rebound.  Musculoskeletal:  General: No tenderness. Normal range of motion.     Cervical back: Normal range of motion.  Lymphadenopathy:     Cervical: No cervical adenopathy.  Skin:    General: Skin is warm and dry.     Findings: No rash.  Neurological:     Mental Status: He is alert and oriented to person, place, and time.     Cranial Nerves: No cranial nerve deficit.     Motor: No abnormal muscle tone.     Coordination: Coordination normal.     Gait: Gait normal.     Deep Tendon Reflexes: Reflexes are normal and symmetric.  Psychiatric:        Behavior: Behavior normal.        Thought Content: Thought content normal.        Judgment: Judgment normal.     Lab Results  Component Value Date    WBC 9.6 01/15/2019   HGB 16.1 01/15/2019   HCT 47.1 01/15/2019   PLT 234.0 01/15/2019   GLUCOSE 93 07/20/2019   CHOL 214 (H) 07/20/2019   TRIG 98.0 07/20/2019   HDL 55.90 07/20/2019   LDLDIRECT 169.7 06/01/2013   LDLCALC 139 (H) 07/20/2019   ALT 15 07/20/2019   AST 17 07/20/2019   NA 138 07/20/2019   K 4.1 07/20/2019   CL 105 07/20/2019   CREATININE 0.96 07/20/2019   BUN 16 07/20/2019   CO2 25 07/20/2019   TSH 1.50 01/15/2019   PSA 0.90 01/15/2019   INR 0.97 11/22/2012    DG Chest 2 View  Result Date: 01/15/2019 CLINICAL DATA:  Abnormal chest x-ray. EXAM: CHEST - 2 VIEW COMPARISON:  02/06/2018. FINDINGS: The heart size is stable from prior. The lungs are clear. The visualized skeletal structures are unremarkable. IMPRESSION: No active cardiopulmonary disease. Electronically Signed   By: Constance Holster M.D.   On: 01/15/2019 20:03

## 2020-01-26 ENCOUNTER — Ambulatory Visit (INDEPENDENT_AMBULATORY_CARE_PROVIDER_SITE_OTHER)
Admission: RE | Admit: 2020-01-26 | Discharge: 2020-01-26 | Disposition: A | Discharge status: Home / Self Care | Payer: Medicare Other | Source: Ambulatory Visit | Attending: Internal Medicine | Admitting: Internal Medicine

## 2020-01-26 ENCOUNTER — Other Ambulatory Visit: Payer: Self-pay

## 2020-01-26 DIAGNOSIS — R918 Other nonspecific abnormal finding of lung field: Secondary | ICD-10-CM

## 2020-01-26 DIAGNOSIS — J8410 Pulmonary fibrosis, unspecified: Secondary | ICD-10-CM | POA: Diagnosis not present

## 2020-03-01 ENCOUNTER — Encounter: Payer: Self-pay | Admitting: Internal Medicine

## 2020-03-04 ENCOUNTER — Other Ambulatory Visit: Payer: Self-pay | Admitting: Internal Medicine

## 2020-03-14 ENCOUNTER — Telehealth: Payer: Medicare Other | Admitting: Family

## 2020-03-14 DIAGNOSIS — J069 Acute upper respiratory infection, unspecified: Secondary | ICD-10-CM | POA: Diagnosis not present

## 2020-03-14 MED ORDER — BENZONATATE 100 MG PO CAPS: 100.0000 mg | ORAL_CAPSULE | Freq: Three times a day (TID) | ORAL | 0 refills | Status: DC | PRN

## 2020-03-14 NOTE — Progress Notes (Signed)
We are sorry you are not feeling well.  Here is how we plan to help!  Based on what you have shared with me, it looks like you may have a viral upper respiratory infection.  Upper respiratory infections are caused by a large number of viruses; however, rhinovirus is the most common cause.   Symptoms vary from person to person, with common symptoms including sore throat, cough, fatigue or lack of energy and feeling of general discomfort.  A low-grade fever of up to 100.4 may present, but is often uncommon.  Symptoms vary however, and are closely related to a person's age or underlying illnesses.  The most common symptoms associated with an upper respiratory infection are nasal discharge or congestion, cough, sneezing, headache and pressure in the ears and face.  These symptoms usually persist for about 3 to 10 days, but can last up to 2 weeks.  It is important to know that upper respiratory infections do not cause serious illness or complications in most cases.    Upper respiratory infections can be transmitted from person to person, with the most common method of transmission being a person's hands.  The virus is able to live on the skin and can infect other persons for up to 2 hours after direct contact.  Also, these can be transmitted when someone coughs or sneezes; thus, it is important to cover the mouth to reduce this risk.  To keep the spread of the illness at [place], good hand hygiene is very important.  This is an infection that is most likely caused by a virus. There are no specific treatments other than to help you with the symptoms until the infection runs its course.  We are sorry you are not feeling well.  Here is how we plan to help!   For nasal congestion, you may use an oral decongestants such as Mucinex D or if you have glaucoma or high blood pressure use plain Mucinex.  Saline nasal spray or nasal drops can help and can safely be used as often as needed for congestion.  Continue to use your  Singulair, Claritin, and Flonase.    If you do not have a history of heart disease, hypertension, diabetes or thyroid disease, prostate/bladder issues or glaucoma, you may also use Sudafed to treat nasal congestion.  It is highly recommended that you consult with a pharmacist or your primary care physician to ensure this medication is safe for you to take.     If you have a cough, you may use cough suppressants such as Delsym and Robitussin.  If you have glaucoma or high blood pressure, you can also use Coricidin HBP.   For cough I have prescribed for you A prescription cough medication called Tessalon Perles 100 mg. You may take 1-2 capsules every 8 hours as needed for cough.  If you have a sore or scratchy throat, use a saltwater gargle-  to  teaspoon of salt dissolved in a 4-ounce to 8-ounce glass of warm water.  Gargle the solution for approximately 15-30 seconds and then spit.  It is important not to swallow the solution.  You can also use throat lozenges/cough drops and Chloraseptic spray to help with throat pain or discomfort.  Warm or cold liquids can also be helpful in relieving throat pain.  For headache, pain or general discomfort, you can use Ibuprofen or Tylenol as directed.   Some authorities believe that zinc sprays or the use of Echinacea may shorten the course of your  symptoms.   HOME CARE . Only take medications as instructed by your medical team. . Be sure to drink plenty of fluids. Water is fine as well as fruit juices, sodas and electrolyte beverages. You may want to stay away from caffeine or alcohol. If you are nauseated, try taking small sips of liquids. How do you know if you are getting enough fluid? Your urine should be a pale yellow or almost colorless. . Get rest. . Taking a steamy shower or using a humidifier may help nasal congestion and ease sore throat pain. You can place a towel over your head and breathe in the steam from hot water coming from a faucet. . Using  a saline nasal spray works much the same way. . Cough drops, hard candies and sore throat lozenges may ease your cough. . Avoid close contacts especially the very young and the elderly . Cover your mouth if you cough or sneeze . Always remember to wash your hands.   GET HELP RIGHT AWAY IF: . You develop worsening fever. . If your symptoms do not improve within 10 days . You develop yellow or green discharge from your nose over 3 days. . You have coughing fits . You develop a severe head ache or visual changes. . You develop shortness of breath, difficulty breathing or start having chest pain . Your symptoms persist after you have completed your treatment plan  MAKE SURE YOU   Understand these instructions.  Will watch your condition.  Will get help right away if you are not doing well or get worse.  Your e-visit answers were reviewed by a board certified advanced clinical practitioner to complete your personal care plan. Depending upon the condition, your plan could have included both over the counter or prescription medications. Please review your pharmacy choice. If there is a problem, you may call our nursing hot line at and have the prescription routed to another pharmacy. Your safety is important to [place]. If you have drug allergies check your prescription carefully.   You can use MyChart to ask questions about today's visit, request a non-urgent call back, or ask for a work or school excuse for 24 hours related to this e-Visit. If it has been greater than 24 hours you will need to follow up with your provider, or enter a new e-Visit to address those concerns. You will get an e-mail in the next two days asking about your experience.  I hope that your e-visit has been valuable and will speed your recovery. Thank you for using e-visits.   Approximately 5 minutes was spent documenting and reviewing patient's chart.

## 2020-03-18 ENCOUNTER — Ambulatory Visit: Payer: Medicare Other | Attending: Internal Medicine

## 2020-03-18 DIAGNOSIS — Z20822 Contact with and (suspected) exposure to covid-19: Secondary | ICD-10-CM

## 2020-03-19 LAB — NOVEL CORONAVIRUS, NAA: SARS-CoV-2, NAA: NOT DETECTED

## 2020-03-19 LAB — SARS-COV-2, NAA 2 DAY TAT

## 2020-03-24 ENCOUNTER — Ambulatory Visit (INDEPENDENT_AMBULATORY_CARE_PROVIDER_SITE_OTHER): Payer: Medicare Other | Admitting: Internal Medicine

## 2020-03-24 ENCOUNTER — Encounter: Payer: Self-pay | Admitting: Internal Medicine

## 2020-03-24 DIAGNOSIS — J069 Acute upper respiratory infection, unspecified: Secondary | ICD-10-CM

## 2020-03-24 DIAGNOSIS — I10 Essential (primary) hypertension: Secondary | ICD-10-CM | POA: Diagnosis not present

## 2020-03-24 DIAGNOSIS — F5101 Primary insomnia: Secondary | ICD-10-CM

## 2020-03-24 MED ORDER — METHYLPREDNISOLONE 4 MG PO TBPK
ORAL_TABLET | ORAL | 0 refills | Status: DC
Start: 2020-03-24 — End: 2020-04-18

## 2020-03-24 MED ORDER — DOXYCYCLINE HYCLATE 100 MG PO TABS: 100.0000 mg | ORAL_TABLET | Freq: Two times a day (BID) | ORAL | 0 refills | Status: DC

## 2020-03-24 NOTE — Progress Notes (Signed)
Subjective:  Patient ID: Raymond Snow, male    DOB: [birth year]  Age: 69 y.o. MRN: [id number]  CC: No chief complaint on file.   HPI CHON BUHL presents for URI x 2 wks - 80% better C/o occ wheezing at night\ Follow-up on hypertension, insomnia   Outpatient Medications Prior to Visit  Medication Sig Dispense Refill  . amLODipine (NORVASC) 10 MG tablet TAKE (1) TABLET DAILY IN THE MORNING. 90 tablet 3  . aspirin 81 MG chewable tablet Chew 81 mg by mouth daily.      . benzonatate (TESSALON PERLES) 100 MG capsule Take 1 capsule (100 mg total) by mouth 3 (three) times daily as needed. 20 capsule 0  . fluticasone (FLONASE) 50 MCG/ACT nasal spray Place 1-2 sprays into both nostrils daily as needed for rhinitis. 48 g 3  . irbesartan (AVAPRO) 300 MG tablet Take 1 tablet (300 mg total) by mouth daily. 90 tablet 3  . montelukast (SINGULAIR) 10 MG tablet Take 1 tablet (10 mg total) by mouth daily. 90 tablet 3  . rosuvastatin (CRESTOR) 5 MG tablet Take 1 tablet (5 mg total) by mouth daily. As directed 90 tablet 3  . valACYclovir (VALTREX) 1000 MG tablet TAKE (1/2) TABLET TWICE DAILY. 90 tablet 3  . zolpidem (AMBIEN) 10 MG tablet 1 po qhs prn 90 tablet 1   No facility-administered medications prior to visit.    ROS: Review of Systems  Constitutional: Negative for appetite change, fatigue and unexpected weight change.  HENT: Positive for congestion. Negative for nosebleeds, sneezing, sore throat and trouble swallowing.   Eyes: Negative for itching and visual disturbance.  Respiratory: Positive for wheezing. Negative for cough.   Cardiovascular: Negative for chest pain, palpitations and leg swelling.  Gastrointestinal: Negative for abdominal distention, blood in stool, diarrhea and nausea.  Genitourinary: Negative for frequency and hematuria.  Musculoskeletal: Negative for back pain, gait problem, joint swelling and neck pain.  Skin: Negative for rash.  Neurological: Negative for  dizziness, tremors, speech difficulty and weakness.  Psychiatric/Behavioral: Negative for agitation, dysphoric mood and sleep disturbance. The patient is not nervous/anxious.     Objective:  BP 106/60 (BP Location: Left Arm, Patient Position: Sitting, Cuff Size: Large)   Pulse 72   Temp 98.1 F (36.7 C) (Oral)   Ht 5\' 8"  (1.727 m)   Wt 191 lb (86.6 kg)   SpO2 98%   BMI 29.04 kg/m   BP Readings from Last 3 Encounters:  03/24/20 106/60  01/14/20 130/80  07/20/19 130/78    Wt Readings from Last 3 Encounters:  03/24/20 191 lb (86.6 kg)  01/14/20 197 lb (89.4 kg)  07/20/19 205 lb (93 kg)    Physical Exam Constitutional:      General: He is not in acute distress.    Appearance: He is well-developed.     Comments: NAD  Eyes:     Conjunctiva/sclera: Conjunctivae normal.     Pupils: Pupils are equal, round, and reactive to light.  Neck:     Thyroid: No thyromegaly.     Vascular: No JVD.  Cardiovascular:     Rate and Rhythm: Normal rate and regular rhythm.     Heart sounds: Normal heart sounds. No murmur heard.  No friction rub. No gallop.   Pulmonary:     Effort: Pulmonary effort is normal. No respiratory distress.     Breath sounds: Wheezing, rhonchi and rales present.  Chest:     Chest wall: No tenderness.  Abdominal:  General: Bowel sounds are normal. There is no distension.     Palpations: Abdomen is soft. There is no mass.     Tenderness: There is no abdominal tenderness. There is no guarding or rebound.  Musculoskeletal:        General: No tenderness. Normal range of motion.     Cervical back: Normal range of motion.  Lymphadenopathy:     Cervical: No cervical adenopathy.  Skin:    General: Skin is warm and dry.     Findings: No rash.  Neurological:     Mental Status: He is alert and oriented to person, place, and time.     Cranial Nerves: No cranial nerve deficit.     Motor: No abnormal muscle tone.     Coordination: Coordination normal.     Gait: Gait  normal.     Deep Tendon Reflexes: Reflexes are normal and symmetric.  Psychiatric:        Behavior: Behavior normal.        Thought Content: Thought content normal.        Judgment: Judgment normal.   L lung base rhonchi  Lab Results  Component Value Date   WBC 7.6 01/14/2020   HGB 15.1 01/14/2020   HCT 45.4 01/14/2020   PLT 210.0 01/14/2020   GLUCOSE 90 01/14/2020   CHOL 169 01/14/2020   TRIG 93.0 01/14/2020   HDL 74.70 01/14/2020   LDLDIRECT 169.7 06/01/2013   LDLCALC 75 01/14/2020   ALT 13 01/14/2020   AST 18 01/14/2020   NA 137 01/14/2020   K 4.3 01/14/2020   CL 103 01/14/2020   CREATININE 0.95 01/14/2020   BUN 13 01/14/2020   CO2 29 01/14/2020   TSH 1.02 01/14/2020   PSA 0.96 01/14/2020   INR 0.97 11/22/2012    CT Chest Wo Contrast  Result Date: 01/26/2020 CLINICAL DATA:  Abnormal chest x-ray. 8 mm right lung nodule. Prior smoker. EXAM: CT CHEST WITHOUT CONTRAST TECHNIQUE: Multidetector CT imaging of the chest was performed following the standard protocol without IV contrast. COMPARISON:  Radiograph 01/14/2020.  Chest CT 12/24/2017 FINDINGS: Cardiovascular: Mild aortic atherosclerosis. No aortic aneurysm. Coronary artery calcifications. Heart is normal in size. No pericardial effusion. Mediastinum/Nodes: No enlarged mediastinal lymph nodes. No evidence of hilar adenopathy. Subcentimeter calcified left thyroid nodule, no dedicated imaging follow-up is needed given size. Tiny hiatal hernia. No esophageal wall thickening. Lungs/Pleura: In the superior segment of the right lower lobe there is a 4 x 8 mm calcified nodule (6 mm mean diameter) on image 3 of series 57. Minimal adjacent scarring in the pulmonary parenchyma. This corresponds to radiographic abnormality, and is unchanged from prior chest CT. Tiny subpleural nodule in the left upper lobe, series 3, image 41, unchanged from 2019 and considered benign. No suspicious or new pulmonary nodule. No focal airspace disease. No  pleural fluid. Minimal retained mucus in the trachea and mainstem bronchi are patent Upper Abdomen: No acute finding. Musculoskeletal: Mild degenerative change in the spine. There are no acute or suspicious osseous abnormalities. IMPRESSION: Nodule in the superior segment of the right lower lobe on radiograph represents a calcified granuloma, benign. No dedicated imaging follow-up is needed. Coronary artery calcifications. Aortic Atherosclerosis (ICD10-I70.0). Electronically Signed   By: Keith Rake M.D.   On: 01/26/2020 20:34    Assessment & Plan:   There are no diagnoses linked to this encounter.   No orders of the defined types were placed in this encounter.    Follow-up: No follow-ups  on file.  Walker Kehr, MD

## 2020-03-24 NOTE — Assessment & Plan Note (Signed)
Doxy x 10 d Medrol pack CXR if not better

## 2020-03-27 ENCOUNTER — Encounter: Payer: Self-pay | Admitting: Internal Medicine

## 2020-03-27 NOTE — Assessment & Plan Note (Signed)
Norvasc Valsartan

## 2020-03-27 NOTE — Assessment & Plan Note (Signed)
On zolpidem

## 2020-04-01 ENCOUNTER — Encounter: Payer: Self-pay | Admitting: *Deleted

## 2020-04-14 ENCOUNTER — Other Ambulatory Visit: Payer: Self-pay | Admitting: Internal Medicine

## 2020-04-14 MED ORDER — AMLODIPINE BESYLATE 10 MG PO TABS: 5.0000 mg | ORAL_TABLET | Freq: Every day | ORAL | 3 refills | Status: DC

## 2020-04-18 ENCOUNTER — Other Ambulatory Visit: Payer: Self-pay

## 2020-04-18 ENCOUNTER — Encounter: Payer: Self-pay | Admitting: Internal Medicine

## 2020-04-18 ENCOUNTER — Ambulatory Visit (AMBULATORY_SURGERY_CENTER): Payer: Self-pay | Admitting: *Deleted

## 2020-04-18 VITALS — Ht 68.0 in | Wt 190.0 lb

## 2020-04-18 DIAGNOSIS — Z1211 Encounter for screening for malignant neoplasm of colon: Secondary | ICD-10-CM

## 2020-04-18 MED ORDER — SUTAB 1479-225-188 MG PO TABS: 1.0000 | ORAL_TABLET | ORAL | 0 refills | Status: DC

## 2020-04-18 NOTE — Progress Notes (Signed)
Patient is here in-person for PV. Patient denies any allergies to eggs or soy. Patient denies any problems with anesthesia/sedation. Patient denies any oxygen use at home. Patient denies taking any diet/weight loss medications or blood thinners. Patient is not being treated for MRSA or C-diff. Patient is aware of our care-partner policy and PCHEK-35 safety protocol. Marland Kitchen   COVID-19 vaccines completed per pt Prep Prescription coupon given to the patient.

## 2020-04-30 IMAGING — DX CHEST - 2 VIEW
2 series · 2 of 2 positions shown · non-contrast
Comparison: 02/06/2018.

CLINICAL DATA: Abnormal chest x-ray.

EXAM:
CHEST - 2 VIEW

[chest pa]
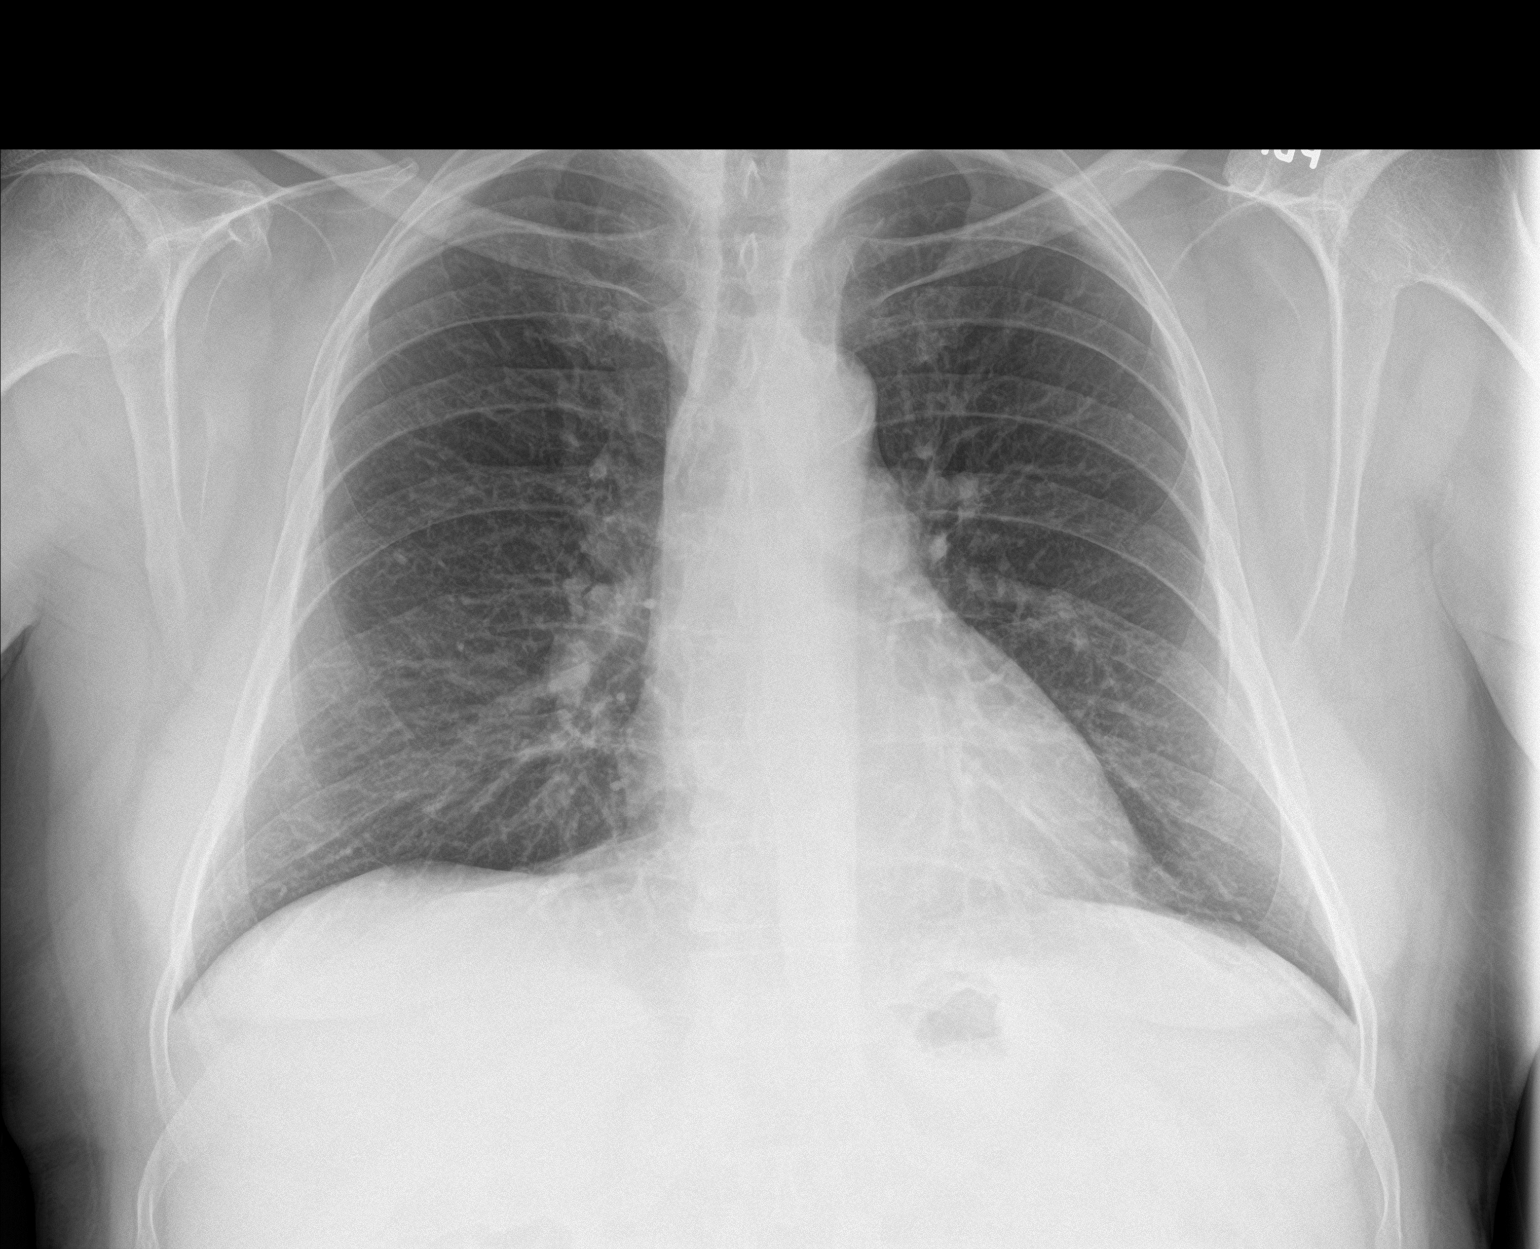

[chest lat]
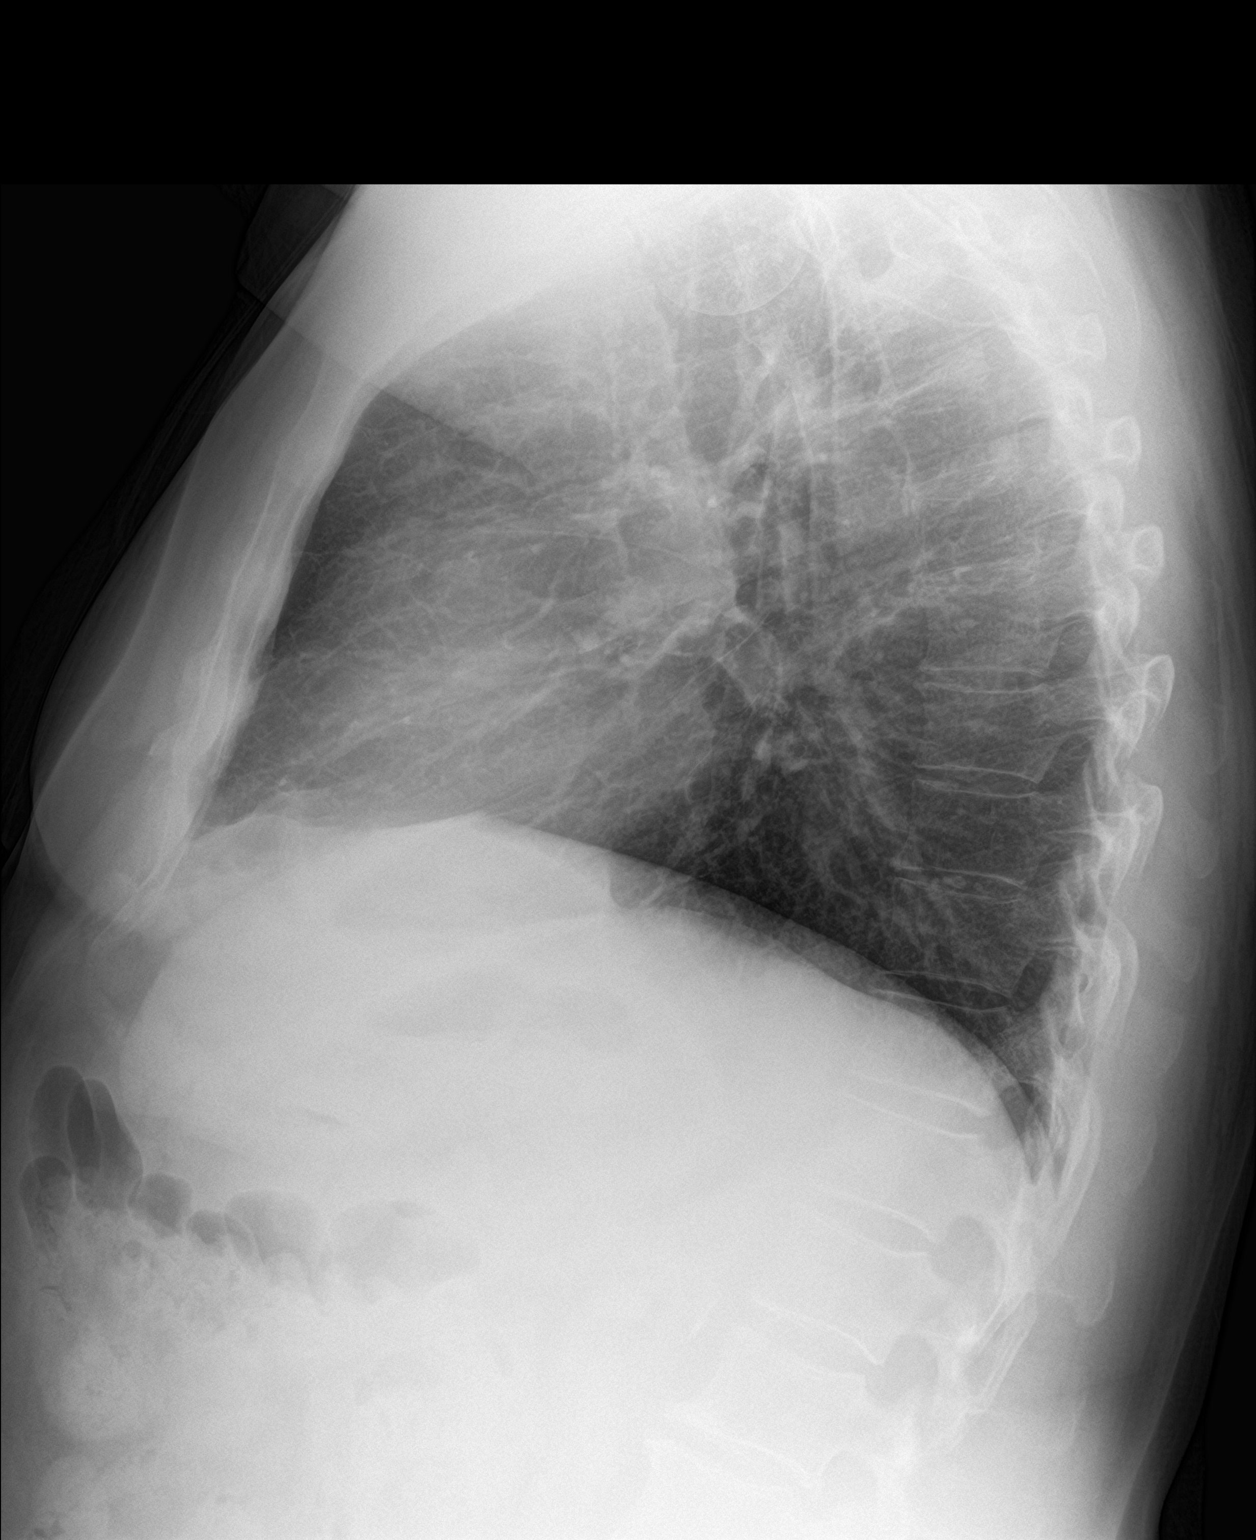

[2 of 2 positions shown; findings below may reference images not displayed]

FINDINGS: The heart size is stable from prior. The lungs are clear. The
visualized skeletal structures are unremarkable.
IMPRESSION: No active cardiopulmonary disease.

## 2020-05-02 ENCOUNTER — Other Ambulatory Visit: Payer: Self-pay

## 2020-05-02 ENCOUNTER — Encounter: Payer: Self-pay | Admitting: Internal Medicine

## 2020-05-02 ENCOUNTER — Ambulatory Visit (AMBULATORY_SURGERY_CENTER): Payer: Medicare Other | Admitting: Internal Medicine

## 2020-05-02 VITALS — BP 120/76 | HR 68 | Temp 96.6°F | Resp 18 | Ht 68.0 in | Wt 190.0 lb

## 2020-05-02 DIAGNOSIS — D125 Benign neoplasm of sigmoid colon: Secondary | ICD-10-CM

## 2020-05-02 DIAGNOSIS — Z1211 Encounter for screening for malignant neoplasm of colon: Secondary | ICD-10-CM | POA: Diagnosis not present

## 2020-05-02 MED ORDER — SODIUM CHLORIDE 0.9 % IV SOLN: 500.0000 mL | Freq: Once | INTRAVENOUS | Status: DC

## 2020-05-02 NOTE — Progress Notes (Signed)
pt tolerated well. VSS. awake and to recovery. Report given to RN.  

## 2020-05-02 NOTE — Progress Notes (Signed)
No problems noted in the recovery room. maw 

## 2020-05-02 NOTE — Op Note (Signed)
[name] Procedure Date: 05/02/2020 1:12 PM MRN: [id number] Endoscopist: Docia Chuck. Henrene Pastor , MD Age: 69 Referring MD:  Date of Birth: 08-06-1951 Gender: Male Account #: [id number] Procedure:                Colonoscopy with cold snare polypectomy x 1 Indications:              Screening for colorectal malignant neoplasm.                            Reports negative colonoscopy (diverticulosis) 2011                            ([facility]) Medicines:                Monitored Anesthesia Care Procedure:                Pre-Anesthesia Assessment:                           - Prior to the procedure, a History and Physical                            was performed, and patient medications and                            allergies were reviewed. The patient's tolerance of                            previous anesthesia was also reviewed. The risks                            and benefits of the procedure and the sedation                            options and risks were discussed with the patient.                            All questions were answered, and informed consent                            was obtained. Prior Anticoagulants: The patient has                            taken no previous anticoagulant or antiplatelet                            agents. ASA Grade Assessment: II - A patient with                            mild systemic disease. After reviewing the risks                            and benefits, the patient was deemed in  satisfactory condition to undergo the procedure.                           After obtaining informed consent, the colonoscope                            was passed under direct vision. Throughout the                            procedure, the patient's blood pressure, pulse, and                            oxygen saturations were monitored continuously. The                            Colonoscope was  introduced through the anus and                            advanced to the the cecum, identified by                            appendiceal orifice and ileocecal valve. The                            ileocecal valve, appendiceal orifice, and rectum                            were photographed. The quality of the bowel                            preparation was good. The colonoscopy was performed                            without difficulty. The patient tolerated the                            procedure well. The bowel preparation used was                            SUPREP via split dose instruction. Scope In: 1:41:10 PM Scope Out: 1:54:55 PM Scope Withdrawal Time: 0 hours 11 minutes 4 seconds  Total Procedure Duration: 0 hours 13 minutes 45 seconds  Findings:                 A 4 mm polyp was found in the proximal sigmoid                            colon. The polyp was removed with a cold snare.                            Resection and retrieval were complete.                           Multiple diverticula were found in the left colon  and right colon.                           Internal hemorrhoids were found during retroflexion.                           The exam was otherwise without abnormality on                            direct and retroflexion views. Complications:            No immediate complications. Estimated blood loss:                            None. Estimated Blood Loss:     Estimated blood loss: none. Impression:               - One 4 mm polyp in the proximal sigmoid colon,                            removed with a cold snare. Resected and retrieved.                           - Diverticulosis in the left colon and in the right                            colon.                           - Internal hemorrhoids.                           - The examination was otherwise normal on direct                            and retroflexion  views. Recommendation:           - Repeat colonoscopy in 7-10 years for surveillance.                           - Patient has a contact number available for                            emergencies. The signs and symptoms of potential                            delayed complications were discussed with the                            patient. Return to normal activities tomorrow.                            Written discharge instructions were provided to the                            patient.                           -  Resume previous diet.                           - Continue present medications.                           - Await pathology results. Docia Chuck. Henrene Pastor, MD 05/02/2020 2:03:27 PM This report has been signed electronically.

## 2020-05-02 NOTE — Progress Notes (Signed)
Called to room to assist during endoscopic procedure.  Patient ID and intended procedure confirmed with present staff. Received instructions for my participation in the procedure from the performing physician.  

## 2020-05-02 NOTE — Patient Instructions (Addendum)
Handouts were given to you on polyps, diverticulosis, and hemorrhoids. You may resume your current medications today. Await biopsy results. Please call if any questions or concerns.   YOU HAD AN ENDOSCOPIC PROCEDURE TODAY AT THE [facility]:   Refer to the procedure report that was given to you for any specific questions about what was found during the examination.  If the procedure report does not answer your questions, please call your gastroenterologist to clarify.  If you requested that your care partner not be given the details of your procedure findings, then the procedure report has been included in a sealed envelope for you to review at your convenience later.  YOU SHOULD EXPECT: Some feelings of bloating in the abdomen. Passage of more gas than usual.  Walking can help get rid of the air that was put into your GI tract during the procedure and reduce the bloating. If you had a lower endoscopy (such as a colonoscopy or flexible sigmoidoscopy) you may notice spotting of blood in your stool or on the toilet paper. If you underwent a bowel prep for your procedure, you may not have a normal bowel movement for a few days.  Please Note:  You might notice some irritation and congestion in your nose or some drainage.  This is from the oxygen used during your procedure.  There is no need for concern and it should clear up in a day or so.  SYMPTOMS TO REPORT IMMEDIATELY:   Following lower endoscopy (colonoscopy or flexible sigmoidoscopy):  Excessive amounts of blood in the stool  Significant tenderness or worsening of abdominal pains  Swelling of the abdomen that is new, acute  Fever of 100F or higher   For urgent or emergent issues, a gastroenterologist can be reached at any hour by calling [phone]. Do not use MyChart messaging for urgent concerns.    DIET:  We do recommend a small meal at first, but then you may proceed to your regular diet.  Drink plenty of fluids but  you should avoid alcoholic beverages for 24 hours.  ACTIVITY:  You should plan to take it easy for the rest of today and you should NOT DRIVE or use heavy machinery until tomorrow (because of the sedation medicines used during the test).    FOLLOW UP: Our staff will call the number listed on your records 48-72 hours following your procedure to check on you and address any questions or concerns that you may have regarding the information given to you following your procedure. If we do not reach you, we will leave a message.  We will attempt to reach you two times.  During this call, we will ask if you have developed any symptoms of COVID 19. If you develop any symptoms (ie: fever, flu-like symptoms, shortness of breath, cough etc.) before then, please call [phone].  If you test positive for Covid 19 in the 2 weeks post procedure, please call and report this information to us.    If any biopsies were taken you will be contacted by phone or by letter within the next 1-3 weeks.  Please call us at [phone] if you have not heard about the biopsies in 3 weeks.    SIGNATURES/CONFIDENTIALITY: You and/or your care partner have signed paperwork which will be entered into your electronic medical record.  These signatures attest to the fact that that the information above on your After Visit Summary has been reviewed and is understood.  Full responsibility of the   confidentiality of this discharge information lies with you and/or your care-partner. 

## 2020-05-02 NOTE — Progress Notes (Signed)
Pt's states no medical or surgical changes since previsit or office visit.  Vitals- Sheila 

## 2020-05-04 ENCOUNTER — Telehealth: Payer: Self-pay

## 2020-05-04 NOTE — Telephone Encounter (Signed)
  Follow up Call-  Call back number 05/02/2020  Post procedure Call Back phone  # [phone]  Permission to leave phone message Yes  Some recent data might be hidden     Left message

## 2020-05-04 NOTE — Telephone Encounter (Signed)
  Follow up Call-  Call back number 05/02/2020  Post procedure Call Back phone  # [phone]  Permission to leave phone message Yes  Some recent data might be hidden     Patient questions:  Do you have a fever, pain , or abdominal swelling? No. Pain Score  0 *  Have you tolerated food without any problems? Yes.    Have you been able to return to your normal activities? Yes.    Do you have any questions about your discharge instructions: Diet   No. Medications  No. Follow up visit  No.  Do you have questions or concerns about your Care? No.  Actions: * If pain score is 4 or above: No action needed, pain <4.  1. Have you developed a fever since your procedure? no  2.   Have you had an respiratory symptoms (SOB or cough) since your procedure? no  3.   Have you tested positive for COVID 19 since your procedure no  4.   Have you had any family members/close contacts diagnosed with the COVID 19 since your procedure?  no   If yes to any of these questions please route to Joylene John, RN and Joella Prince, RN

## 2020-05-06 ENCOUNTER — Ambulatory Visit (INDEPENDENT_AMBULATORY_CARE_PROVIDER_SITE_OTHER): Payer: Medicare Other

## 2020-05-06 ENCOUNTER — Encounter: Payer: Self-pay | Admitting: Internal Medicine

## 2020-05-06 ENCOUNTER — Other Ambulatory Visit: Payer: Self-pay

## 2020-05-06 DIAGNOSIS — Z23 Encounter for immunization: Secondary | ICD-10-CM | POA: Diagnosis not present

## 2020-06-09 DIAGNOSIS — Z961 Presence of intraocular lens: Secondary | ICD-10-CM | POA: Diagnosis not present

## 2020-06-09 DIAGNOSIS — H5213 Myopia, bilateral: Secondary | ICD-10-CM | POA: Diagnosis not present

## 2020-06-30 ENCOUNTER — Encounter: Payer: Self-pay | Admitting: Internal Medicine

## 2020-06-30 ENCOUNTER — Ambulatory Visit (INDEPENDENT_AMBULATORY_CARE_PROVIDER_SITE_OTHER): Payer: Medicare Other | Admitting: Internal Medicine

## 2020-06-30 ENCOUNTER — Other Ambulatory Visit: Payer: Self-pay

## 2020-06-30 VITALS — BP 120/78 | HR 69 | Temp 98.5°F | Wt 195.6 lb

## 2020-06-30 DIAGNOSIS — M7520 Bicipital tendinitis, unspecified shoulder: Secondary | ICD-10-CM | POA: Insufficient documentation

## 2020-06-30 DIAGNOSIS — M7522 Bicipital tendinitis, left shoulder: Secondary | ICD-10-CM | POA: Diagnosis not present

## 2020-06-30 DIAGNOSIS — I10 Essential (primary) hypertension: Secondary | ICD-10-CM | POA: Diagnosis not present

## 2020-06-30 DIAGNOSIS — L570 Actinic keratosis: Secondary | ICD-10-CM

## 2020-06-30 DIAGNOSIS — Z0000 Encounter for general adult medical examination without abnormal findings: Secondary | ICD-10-CM | POA: Diagnosis not present

## 2020-06-30 NOTE — Assessment & Plan Note (Signed)
See Cryo 

## 2020-06-30 NOTE — Progress Notes (Signed)
Subjective:  Patient ID: Raymond Snow, male    DOB: 10-Sep-1950  Age: 69 y.o. MRN: [id number]  CC: Arm Pain ((L) arm)   HPI Raymond Snow presents for HTN, CAD C/o lack of sense of taste and smell post-COVID C/o L shoulder pain off and on - severe at times - x weeks  Outpatient Medications Prior to Visit  Medication Sig Dispense Refill  . amLODipine (NORVASC) 10 MG tablet Take 0.5 tablets (5 mg total) by mouth daily. TAKE (1) TABLET DAILY IN THE MORNING. (Patient taking differently: Take 5 mg by mouth daily. TAKE 1/2 TABLET DAILY IN THE MORNING.) 90 tablet 3  . aspirin 81 MG chewable tablet Chew 81 mg by mouth daily.      . fluticasone (FLONASE) 50 MCG/ACT nasal spray Place 1-2 sprays into both nostrils daily as needed for rhinitis. 48 g 3  . irbesartan (AVAPRO) 300 MG tablet Take 1 tablet (300 mg total) by mouth daily. 90 tablet 3  . loratadine (CLARITIN) 10 MG tablet Take 10 mg by mouth daily.    . montelukast (SINGULAIR) 10 MG tablet Take 1 tablet (10 mg total) by mouth daily. 90 tablet 3  . rosuvastatin (CRESTOR) 5 MG tablet Take 1 tablet (5 mg total) by mouth daily. As directed 90 tablet 3  . valACYclovir (VALTREX) 1000 MG tablet TAKE (1/2) TABLET TWICE DAILY. 90 tablet 3  . zolpidem (AMBIEN) 10 MG tablet 1 po qhs prn 90 tablet 1   No facility-administered medications prior to visit.    ROS: Review of Systems  Constitutional: Negative for appetite change, fatigue and unexpected weight change.  HENT: Negative for congestion, nosebleeds, sneezing, sore throat and trouble swallowing.   Eyes: Negative for itching and visual disturbance.  Respiratory: Negative for cough.   Cardiovascular: Negative for chest pain, palpitations and leg swelling.  Gastrointestinal: Negative for abdominal distention, blood in stool, diarrhea and nausea.  Genitourinary: Negative for frequency and hematuria.  Musculoskeletal: Negative for back pain, gait problem, joint swelling and neck pain.   Skin: Negative for rash.  Neurological: Negative for dizziness, tremors, speech difficulty and weakness.  Psychiatric/Behavioral: Negative for agitation, dysphoric mood and sleep disturbance. The patient is not nervous/anxious.     Objective:  BP 120/78 (BP Location: Left Arm)   Pulse 69   Temp 98.5 F (36.9 C) (Oral)   Wt 195 lb 9.6 oz (88.7 kg)   SpO2 97%   BMI 29.74 kg/m   BP Readings from Last 3 Encounters:  06/30/20 120/78  05/02/20 120/76  03/24/20 106/60    Wt Readings from Last 3 Encounters:  06/30/20 195 lb 9.6 oz (88.7 kg)  05/02/20 190 lb (86.2 kg)  04/18/20 190 lb (86.2 kg)    Physical Exam Constitutional:      General: He is not in acute distress.    Appearance: He is well-developed.     Comments: NAD  Eyes:     Conjunctiva/sclera: Conjunctivae normal.     Pupils: Pupils are equal, round, and reactive to light.  Neck:     Thyroid: No thyromegaly.     Vascular: No JVD.  Cardiovascular:     Rate and Rhythm: Normal rate and regular rhythm.     Heart sounds: Normal heart sounds. No murmur heard.  No friction rub. No gallop.   Pulmonary:     Effort: Pulmonary effort is normal. No respiratory distress.     Breath sounds: Normal breath sounds. No wheezing or rales.  Chest:  Chest wall: No tenderness.  Abdominal:     General: Bowel sounds are normal. There is no distension.     Palpations: Abdomen is soft. There is no mass.     Tenderness: There is no abdominal tenderness. There is no guarding or rebound.  Musculoskeletal:        General: No tenderness. Normal range of motion.     Cervical back: Normal range of motion.  Lymphadenopathy:     Cervical: No cervical adenopathy.  Skin:    General: Skin is warm and dry.     Findings: No rash.  Neurological:     Mental Status: He is alert and oriented to person, place, and time.     Cranial Nerves: No cranial nerve deficit.     Motor: No abnormal muscle tone.     Coordination: Coordination normal.      Gait: Gait normal.     Deep Tendon Reflexes: Reflexes are normal and symmetric.  Psychiatric:        Behavior: Behavior normal.        Thought Content: Thought content normal.        Judgment: Judgment normal.   L prox biceps tendon - very tender    AKs - head  Procedure Note :     Procedure : Cryosurgery   Indication:   Actinic keratosis(es) x3 scalp   Risks including unsuccessful procedure , bleeding, infection, bruising, scar, a need for a repeat  procedure and others were explained to the patient in detail as well as the benefits. Informed consent was obtained verbally.    3 lesion(s)  on scalp   was/were treated with liquid nitrogen on a Q-tip in a usual fasion . Band-Aid was applied and antibiotic ointment was given for a later use.   Tolerated well. Complications none.   Postprocedure instructions :     Keep the wounds clean. You can wash them with liquid soap and water. Pat dry with gauze or a Kleenex tissue  Before applying antibiotic ointment and a Band-Aid.   You need to report immediately  if  any signs of infection develop.    Lab Results  Component Value Date   WBC 7.6 01/14/2020   HGB 15.1 01/14/2020   HCT 45.4 01/14/2020   PLT 210.0 01/14/2020   GLUCOSE 90 01/14/2020   CHOL 169 01/14/2020   TRIG 93.0 01/14/2020   HDL 74.70 01/14/2020   LDLDIRECT 169.7 06/01/2013   LDLCALC 75 01/14/2020   ALT 13 01/14/2020   AST 18 01/14/2020   NA 137 01/14/2020   K 4.3 01/14/2020   CL 103 01/14/2020   CREATININE 0.95 01/14/2020   BUN 13 01/14/2020   CO2 29 01/14/2020   TSH 1.02 01/14/2020   PSA 0.96 01/14/2020   INR 0.97 11/22/2012    CT Chest Wo Contrast  Result Date: 01/26/2020 CLINICAL DATA:  Abnormal chest x-ray. 8 mm right lung nodule. Prior smoker. EXAM: CT CHEST WITHOUT CONTRAST TECHNIQUE: Multidetector CT imaging of the chest was performed following the standard protocol without IV contrast. COMPARISON:  Radiograph 01/14/2020.  Chest CT 12/24/2017  FINDINGS: Cardiovascular: Mild aortic atherosclerosis. No aortic aneurysm. Coronary artery calcifications. Heart is normal in size. No pericardial effusion. Mediastinum/Nodes: No enlarged mediastinal lymph nodes. No evidence of hilar adenopathy. Subcentimeter calcified left thyroid nodule, no dedicated imaging follow-up is needed given size. Tiny hiatal hernia. No esophageal wall thickening. Lungs/Pleura: In the superior segment of the right lower lobe there is a 4 x 8 mm  calcified nodule (6 mm mean diameter) on image 3 of series 57. Minimal adjacent scarring in the pulmonary parenchyma. This corresponds to radiographic abnormality, and is unchanged from prior chest CT. Tiny subpleural nodule in the left upper lobe, series 3, image 41, unchanged from 2019 and considered benign. No suspicious or new pulmonary nodule. No focal airspace disease. No pleural fluid. Minimal retained mucus in the trachea and mainstem bronchi are patent Upper Abdomen: No acute finding. Musculoskeletal: Mild degenerative change in the spine. There are no acute or suspicious osseous abnormalities. IMPRESSION: Nodule in the superior segment of the right lower lobe on radiograph represents a calcified granuloma, benign. No dedicated imaging follow-up is needed. Coronary artery calcifications. Aortic Atherosclerosis (ICD10-I70.0). Electronically Signed   By: Keith Rake M.D.   On: 01/26/2020 20:34    Assessment & Plan:    Walker Kehr, MD

## 2020-06-30 NOTE — Assessment & Plan Note (Signed)
Norvasc Valsartan

## 2020-06-30 NOTE — Assessment & Plan Note (Signed)
Voltaren gel  Ice

## 2020-06-30 NOTE — Patient Instructions (Addendum)
You can try [employer]'s [employer]ane Mushroom extract or capsules for memory and nerve damage   Proximal Biceps Tendinitis and Tenosynovitis Rehab Ask your health care provider which exercises are safe for you. Do exercises exactly as told by your health care provider and adjust them as directed. It is normal to feel mild stretching, pulling, tightness, or discomfort as you do these exercises. Stop right away if you feel sudden pain or your pain gets worse. Do not begin these exercises until told by your health care provider. Stretching and range-of-motion exercises These exercises warm up your muscles and joints and improve the movement and flexibility of your arm and shoulder. The exercises also help to relieve pain and stiffness. Forearm rotation 1. Stand or sit with your left / right elbow bent in a 90-degree (right angle). Position your forearm so that the thumb is facing the ceiling (neutral position). 2. Rotate your palm up until it cannot go any farther. 3. Hold this position for __________ seconds. 4. Rotate your palm down until it cannot go any farther. 5. Hold this position for __________ seconds. Repeat __________ times. Complete this exercise __________ times a day. Elbow range of motion 1. Stand or sit with your left / right elbow bent in a 90-degree angle (right angle). Position your forearm so that the thumb is facing the ceiling (neutral position). 2. Slowly bend your elbow as far as you can until you feel a stretch or cannot go any farther. 3. Hold this position for __________ seconds. 4. Slowly straighten your elbow as far as you can until you feel a stretch or cannot go any farther. 5. Hold this position for __________ seconds. Repeat __________ times. Complete this exercise __________ times a day. Biceps stretch 1. Stand facing a wall, or stand by a door frame. 2. Raise your left / right arm out to your side, to your shoulder height. Place the thumb side of your hand against the wall.  Your palm should be facing the floor (palm down). 3. Keeping your arm straight, rotate your body in the opposite direction of the raised arm until you feel a gentle stretch in your biceps. 4. Hold this position for __________ seconds. 5. Slowly return to the starting position. Repeat __________ times. Complete this exercise __________ times a day. Shoulder pendulum  1. Stand near a table or counter that you can hold onto for balance. 2. Bend forward at the waist and let your left / right arm hang straight down. Use your other arm to support you and help you stay balanced. 3. Relax your left / right arm and shoulder muscles, and move your hips and your trunk so your left / right arm swings freely. Your arm should swing because of the motion of your body, not because you are using your arm or shoulder muscles. 4. Keep moving your hips and trunk so your arm swings in the following directions, as told by your health care provider: ? Side to side. ? Forward and backward. ? In clockwise and counterclockwise circles. Repeat __________ times. Complete this exercise __________ times a day. Shoulder flexion, assisted  1. Stand facing a wall. Put your left / right palm on the wall. 2. Slowly move your left / right hand up the wall (flexion). Stop when you feel a stretch in your shoulder, or when you reach the angle that is recommended by your health care provider. ? Use your other hand to help raise your arm, if needed (assisted). ? As your hand  gets higher, you may need to step closer to the wall. ? Avoid shrugging or lifting your shoulder up as you raise your arm. To do this, keep your shoulder blade tucked down toward your spine. 3. Hold this position for __________ seconds. 4. Slowly return to the starting position. Use your other arm to help, if needed. Repeat __________ times. Complete this exercise __________ times a day. Shoulder flexion 1. Stand with your left / right arm hanging down at your  side. 2. Keep your arm straight as you lift your arm forward and toward the ceiling (flexion). 3. Hold this position for __________ seconds. 4. Slowly return to the starting position. Repeat __________ times. Complete this exercise __________ times a day. Sleeper stretch, assisted 1. Lie on your left / right side (injured side) with your hips and knees bent and your left / right arm straight in front of you. 2. Bend your elbow to a 90-degree angle (right angle), so your fingers are pointing to the ceiling. 3. Use your other hand to gently push your arm toward the floor (assisted), stopping when you feel a gentle stretch. ? Keep your shoulder blades lightly squeezed together during the exercise. 4. Hold this position for __________ seconds. 5. Slowly return to the starting position. Repeat __________ times. Complete this exercise __________ times a day. Strengthening exercises These exercises build strength and endurance in your arm and shoulder. Endurance is the ability to use your muscles for a long time, even after they get tired. Biceps curls You can use a weight or an exercise band for this exercise. 1. Sit on a stable chair without armrests, or stand up. 2. Hold a __________ weight in your left / right hand, or hold an exercise band with both hands. Your palms should face up toward the ceiling at the starting position. 3. Bend your left / right elbow and move your hand up toward your shoulder. Keep your other arm straight down, in the starting position. 4. Hold this position for __________ seconds. 5. Slowly return to the starting position. Repeat __________ times. Complete this exercise __________ times a day. Internal shoulder rotation You will use an exercise band secured to a stable object at waist height for this exercise. A door and doorframe work well. 1. Stand sideways next to a door with your left / right arm closest to the door, holding the exercise band in your hand. 2. With  your elbow bent in a 90-degree angle (right angle) and keeping your elbow at your side, bring your hand toward your belly (internal rotation). ? Make sure your wrist is staying straight as you do this exercise. 3. Hold this position for __________ seconds. 4. Slowly return to the starting position. Repeat __________ times. Complete this exercise __________ times a day. External shoulder rotation You will use an exercise band secured to a stable object at waist height for this exercise. A door and doorframe work well. 1. Stand sideways next to a door with your left / right arm away from the door, holding the exercise band in your hand. 2. With your elbow bent in a 90-degree angle (right angle) and keeping your elbow at your side, swing your arm away from your body (external rotation). ? Make sure your wrist is staying straight as you do this exercise. 3. Hold this position for __________ seconds. 4. Slowly return to the starting position. Repeat __________ times. Complete this exercise __________ times a day. External shoulder rotation, side-lying You will use a weight  to do this exercise. 1. Lie on your uninjured side with your left / right arm at your side. Bend your elbow to a 90-degree angle (right angle). Hold a __________ weight in your left / right hand. 2. Keeping your elbow at your side, raise your arm toward the ceiling (external rotation). ? Make sure your wrist is staying straight as you do this exercise. 3. Hold this position for __________ seconds. 4. Slowly return to the starting position. Repeat __________ times. Complete this exercise __________ times a day. Scapular retraction Scapular retraction is the process of pulling the shoulder blades (scapulae) toward each other, and toward the spine. You will need an exercise band to do this exercise. 1. Sit in a stable chair without armrests, or stand up. 2. Secure an exercise band to a stable object in front of you so the band is  at shoulder height. 3. Hold one end of the exercise band in each hand. 4. Squeeze your shoulder blades together and move your elbows slightly behind you (retraction). Do not shrug your shoulders upward while you do this. 5. Hold this position for __________ seconds. 6. Slowly return to the starting position. Repeat __________ times. Complete this exercise __________ times a day. Scapular protraction, supine Scapular protraction is the process of moving your shoulder blades away from each other, and away from the spine, while you lie on your back (supine position). 1. Lie on your back on a firm surface. Hold a __________ weight in your left / right hand. 2. Raise your left / right arm straight into the air so your hand is directly above your shoulder joint. 3. Push the weight into the air so your shoulder (scapula) lifts off the surface that you are lying on. Think of trying to punch the ceiling by only moving your scapula forward (protraction). Do not move your head, neck, or back. 4. Hold this position for __________ seconds. 5. Slowly return to the starting position. Repeat __________ times. Complete this exercise __________ times a day. This information is not intended to replace advice given to you by your health care provider. Make sure you discuss any questions you have with your health care provider. Document Revised: 12/02/2018 Document Reviewed: 08/18/2018 Elsevier Patient Education  [place].   Contour neck pillow? GERD wedge?

## 2020-07-01 ENCOUNTER — Other Ambulatory Visit (INDEPENDENT_AMBULATORY_CARE_PROVIDER_SITE_OTHER): Payer: Medicare Other

## 2020-07-01 DIAGNOSIS — Z0000 Encounter for general adult medical examination without abnormal findings: Secondary | ICD-10-CM | POA: Diagnosis not present

## 2020-07-01 LAB — URINALYSIS
Bilirubin Urine: NEGATIVE
Hgb urine dipstick: NEGATIVE
Ketones, ur: NEGATIVE
Leukocytes,Ua: NEGATIVE
Nitrite: NEGATIVE
Specific Gravity, Urine: >=1.03 — AB (ref 1.000–1.030)
Total Protein, Urine: NEGATIVE
Urine Glucose: NEGATIVE
Urobilinogen, UA: 0.2 (ref 0.0–1.0)
pH: 6 (ref 5.0–8.0)

## 2020-07-01 LAB — CBC WITH DIFFERENTIAL/PLATELET
Basophils Absolute: 0.1 10*3/uL (ref 0.0–0.1)
Basophils Relative: 1.4 % (ref 0.0–3.0)
Eosinophils Absolute: 0.2 10*3/uL (ref 0.0–0.7)
Eosinophils Relative: 2.7 % (ref 0.0–5.0)
HCT: 43.9 % (ref 39.0–52.0)
Hemoglobin: 14.8 g/dL (ref 13.0–17.0)
Lymphocytes Relative: 26.6 % (ref 12.0–46.0)
Lymphs Abs: 2 10*3/uL (ref 0.7–4.0)
MCHC: 33.7 g/dL (ref 30.0–36.0)
MCV: 95.4 fl (ref 78.0–100.0)
Monocytes Absolute: 0.5 10*3/uL (ref 0.1–1.0)
Monocytes Relative: 7.1 % (ref 3.0–12.0)
Neutro Abs: 4.6 10*3/uL (ref 1.4–7.7)
Neutrophils Relative %: 62.2 % (ref 43.0–77.0)
Platelets: 189 10*3/uL (ref 150.0–400.0)
RBC: 4.6 Mil/uL (ref 4.22–5.81)
RDW: 14.5 % (ref 11.5–15.5)
WBC: 7.5 10*3/uL (ref 4.0–10.5)

## 2020-07-01 LAB — COMPREHENSIVE METABOLIC PANEL
ALT: 10 U/L (ref 0–53)
AST: 15 U/L (ref 0–37)
Albumin: 4.2 g/dL (ref 3.5–5.2)
Alkaline Phosphatase: 79 U/L (ref 39–117)
BUN: 14 mg/dL (ref 6–23)
CO2: 29 mEq/L (ref 19–32)
Calcium: 9.3 mg/dL (ref 8.4–10.5)
Chloride: 104 mEq/L (ref 96–112)
Creatinine, Ser: 0.98 mg/dL (ref 0.40–1.50)
GFR: 78.93 mL/min (ref >60.00)
Glucose, Bld: 81 mg/dL (ref 70–99)
Potassium: 4.9 mEq/L (ref 3.5–5.1)
Sodium: 140 mEq/L (ref 135–145)
Total Bilirubin: 0.9 mg/dL (ref 0.2–1.2)
Total Protein: 6.8 g/dL (ref 6.0–8.3)

## 2020-07-01 LAB — LIPID PANEL
Cholesterol: 163 mg/dL (ref 0–200)
HDL: 76 mg/dL (ref >39.00)
LDL Cholesterol: 69 mg/dL (ref 0–99)
NonHDL: 86.58
Total CHOL/HDL Ratio: 2
Triglycerides: 88 mg/dL (ref 0.0–149.0)
VLDL: 17.6 mg/dL (ref 0.0–40.0)

## 2020-07-01 LAB — TSH: TSH: 0.99 u[IU]/mL (ref 0.35–4.50)

## 2020-07-01 LAB — PSA: PSA: 0.7 ng/mL (ref 0.10–4.00)

## 2020-07-18 ENCOUNTER — Ambulatory Visit: Payer: Medicare Other | Admitting: Internal Medicine

## 2020-07-18 ENCOUNTER — Ambulatory Visit (INDEPENDENT_AMBULATORY_CARE_PROVIDER_SITE_OTHER): Payer: Medicare Other

## 2020-07-18 ENCOUNTER — Encounter: Payer: Self-pay | Admitting: Internal Medicine

## 2020-07-18 ENCOUNTER — Other Ambulatory Visit: Payer: Self-pay

## 2020-07-18 VITALS — BP 128/82 | HR 71 | Temp 98.1°F | Wt 196.2 lb

## 2020-07-18 VITALS — BP 128/82 | HR 71 | Temp 98.1°F | Ht 68.0 in | Wt 196.2 lb

## 2020-07-18 DIAGNOSIS — Z0000 Encounter for general adult medical examination without abnormal findings: Secondary | ICD-10-CM | POA: Diagnosis not present

## 2020-07-18 DIAGNOSIS — I10 Essential (primary) hypertension: Secondary | ICD-10-CM

## 2020-07-18 DIAGNOSIS — I2583 Coronary atherosclerosis due to lipid rich plaque: Secondary | ICD-10-CM

## 2020-07-18 DIAGNOSIS — R739 Hyperglycemia, unspecified: Secondary | ICD-10-CM | POA: Insufficient documentation

## 2020-07-18 DIAGNOSIS — E785 Hyperlipidemia, unspecified: Secondary | ICD-10-CM

## 2020-07-18 MED ORDER — ROSUVASTATIN CALCIUM 5 MG PO TABS
5.0000 mg | ORAL_TABLET | Freq: Every day | ORAL | 3 refills | Status: DC
Start: 2020-07-18 — End: 2021-01-17

## 2020-07-18 MED ORDER — AMLODIPINE BESYLATE 5 MG PO TABS: 5.0000 mg | ORAL_TABLET | Freq: Every day | ORAL | 3 refills | Status: DC

## 2020-07-18 MED ORDER — IRBESARTAN 300 MG PO TABS
300.0000 mg | ORAL_TABLET | Freq: Every day | ORAL | 3 refills | Status: DC
Start: 2020-07-18 — End: 2021-01-17

## 2020-07-18 MED ORDER — ZOLPIDEM TARTRATE 10 MG PO TABS: ORAL_TABLET | ORAL | 1 refills | Status: DC

## 2020-07-18 MED ORDER — MONTELUKAST SODIUM 10 MG PO TABS: 10.0000 mg | ORAL_TABLET | Freq: Every day | ORAL | 3 refills | Status: DC

## 2020-07-18 NOTE — Assessment & Plan Note (Signed)
-   Crestor 

## 2020-07-18 NOTE — Patient Instructions (Signed)
Raymond Snow , Thank you for taking time to come for your Medicare Wellness Visit. I appreciate your ongoing commitment to your health goals. Please review the following plan we discussed and let me know if I can assist you in the future.   Screening recommendations/referrals: Colonoscopy: 05/02/2020; due every 7 years Recommended yearly ophthalmology/optometry visit for glaucoma screening and checkup Recommended yearly dental visit for hygiene and checkup  Vaccinations: Influenza vaccine: 05/06/2020 Pneumococcal vaccine: up to date Tdap vaccine: 12/19/2017; due every 10 years Shingles vaccine: up to date   Covid-19: up to date  Advanced directives: Please bring a copy of your health care power of attorney and living will to the office at your convenience.  Conditions/risks identified: Yes; Reviewed health maintenance screenings with patient today and relevant education, vaccines, and/or referrals were provided. Please continue to do your personal lifestyle choices by: daily care of teeth and gums, regular physical activity (goal should be 5 days a week for 30 minutes), eat a healthy diet, avoid tobacco and drug use, limiting any alcohol intake, taking a low-dose aspirin (if not allergic or have been advised by your provider otherwise) and taking vitamins and minerals as recommended by your provider. Continue doing brain stimulating activities (puzzles, reading, adult coloring books, staying active) to keep memory sharp. Continue to eat heart healthy diet (full of fruits, vegetables, whole grains, lean protein, water--limit salt, fat, and sugar intake) and increase physical activity as tolerated.  Next appointment: Please schedule your next Medicare Wellness Visit with your Nurse Health Advisor in 1 year by calling [phone].  Preventive Care 57 Years and Older, Male Preventive care refers to lifestyle choices and visits with your health care provider that can promote health and wellness. What  does preventive care include?  A yearly physical exam. This is also called an annual well check.  Dental exams once or twice a year.  Routine eye exams. Ask your health care provider how often you should have your eyes checked.  Personal lifestyle choices, including:  Daily care of your teeth and gums.  Regular physical activity.  Eating a healthy diet.  Avoiding tobacco and drug use.  Limiting alcohol use.  Practicing safe sex.  Taking low doses of aspirin every day.  Taking vitamin and mineral supplements as recommended by your health care provider. What happens during an annual well check? The services and screenings done by your health care provider during your annual well check will depend on your age, overall health, lifestyle risk factors, and family history of disease. Counseling  Your health care provider may ask you questions about your:  Alcohol use.  Tobacco use.  Drug use.  Emotional well-being.  Home and relationship well-being.  Sexual activity.  Eating habits.  History of falls.  Memory and ability to understand (cognition).  Work and work Statistician. Screening  You may have the following tests or measurements:  Height, weight, and BMI.  Blood pressure.  Lipid and cholesterol levels. These may be checked every 5 years, or more frequently if you are over 57 years old.  Skin check.  Lung cancer screening. You may have this screening every year starting at age 100 if you have a 30-pack-year history of smoking and currently smoke or have quit within the past 15 years.  Fecal occult blood test (FOBT) of the stool. You may have this test every year starting at age 15.  Flexible sigmoidoscopy or colonoscopy. You may have a sigmoidoscopy every 5 years or a colonoscopy every 10  years starting at age 35.  Prostate cancer screening. Recommendations will vary depending on your family history and other risks.  Hepatitis C blood test.  Hepatitis  B blood test.  Sexually transmitted disease (STD) testing.  Diabetes screening. This is done by checking your blood sugar (glucose) after you have not eaten for a while (fasting). You may have this done every 1-3 years.  Abdominal aortic aneurysm (AAA) screening. You may need this if you are a current or former smoker.  Osteoporosis. You may be screened starting at age 43 if you are at high risk. Talk with your health care provider about your test results, treatment options, and if necessary, the need for more tests. Vaccines  Your health care provider may recommend certain vaccines, such as:  Influenza vaccine. This is recommended every year.  Tetanus, diphtheria, and acellular pertussis (Tdap, Td) vaccine. You may need a Td booster every 10 years.  Zoster vaccine. You may need this after age 52.  Pneumococcal 13-valent conjugate (PCV13) vaccine. One dose is recommended after age 23.  Pneumococcal polysaccharide (PPSV23) vaccine. One dose is recommended after age 87. Talk to your health care provider about which screenings and vaccines you need and how often you need them. This information is not intended to replace advice given to you by your health care provider. Make sure you discuss any questions you have with your health care provider. Document Released: 09/02/2015 Document Revised: 04/25/2016 Document Reviewed: 06/07/2015 Elsevier Interactive Patient Education  2017 [place] Prevention in the Home Falls can cause injuries. They can happen to people of all ages. There are many things you can do to make your home safe and to help prevent falls. What can I do on the outside of my home?  Regularly fix the edges of walkways and driveways and fix any cracks.  Remove anything that might make you trip as you walk through a door, such as a raised step or threshold.  Trim any bushes or trees on the path to your home.  Use bright outdoor lighting.  Clear any walking  paths of anything that might make someone trip, such as rocks or tools.  Regularly check to see if handrails are loose or broken. Make sure that both sides of any steps have handrails.  Any raised decks and porches should have guardrails on the edges.  Have any leaves, snow, or ice cleared regularly.  Use sand or salt on walking paths during winter.  Clean up any spills in your garage right away. This includes oil or grease spills. What can I do in the bathroom?  Use night lights.  Install grab bars by the toilet and in the tub and shower. Do not use towel bars as grab bars.  Use non-skid mats or decals in the tub or shower.  If you need to sit down in the shower, use a plastic, non-slip stool.  Keep the floor dry. Clean up any water that spills on the floor as soon as it happens.  Remove soap buildup in the tub or shower regularly.  Attach bath mats securely with double-sided non-slip rug tape.  Do not have throw rugs and other things on the floor that can make you trip. What can I do in the bedroom?  Use night lights.  Make sure that you have a light by your bed that is easy to reach.  Do not use any sheets or blankets that are too big for your bed. They should not hang  down onto the floor.  Have a firm chair that has side arms. You can use this for support while you get dressed.  Do not have throw rugs and other things on the floor that can make you trip. What can I do in the kitchen?  Clean up any spills right away.  Avoid walking on wet floors.  Keep items that you use a lot in easy-to-reach places.  If you need to reach something above you, use a strong step stool that has a grab bar.  Keep electrical cords out of the way.  Do not use floor polish or wax that makes floors slippery. If you must use wax, use non-skid floor wax.  Do not have throw rugs and other things on the floor that can make you trip. What can I do with my stairs?  Do not leave any items  on the stairs.  Make sure that there are handrails on both sides of the stairs and use them. Fix handrails that are broken or loose. Make sure that handrails are as long as the stairways.  Check any carpeting to make sure that it is firmly attached to the stairs. Fix any carpet that is loose or worn.  Avoid having throw rugs at the top or bottom of the stairs. If you do have throw rugs, attach them to the floor with carpet tape.  Make sure that you have a light switch at the top of the stairs and the bottom of the stairs. If you do not have them, ask someone to add them for you. What else can I do to help prevent falls?  Wear shoes that:  Do not have high heels.  Have rubber bottoms.  Are comfortable and fit you well.  Are closed at the toe. Do not wear sandals.  If you use a stepladder:  Make sure that it is fully opened. Do not climb a closed stepladder.  Make sure that both sides of the stepladder are locked into place.  Ask someone to hold it for you, if possible.  Clearly mark and make sure that you can see:  Any grab bars or handrails.  First and last steps.  Where the edge of each step is.  Use tools that help you move around (mobility aids) if they are needed. These include:  Canes.  Walkers.  Scooters.  Crutches.  Turn on the lights when you go into a dark area. Replace any light bulbs as soon as they burn out.  Set up your furniture so you have a clear path. Avoid moving your furniture around.  If any of your floors are uneven, fix them.  If there are any pets around you, be aware of where they are.  Review your medicines with your doctor. Some medicines can make you feel dizzy. This can increase your chance of falling. Ask your doctor what other things that you can do to help prevent falls. This information is not intended to replace advice given to you by your health care provider. Make sure you discuss any questions you have with your health care  provider. Document Released: 06/02/2009 Document Revised: 01/12/2016 Document Reviewed: 09/10/2014 Elsevier Interactive Patient Education  2017 [employer].

## 2020-07-18 NOTE — Assessment & Plan Note (Signed)
  BP Readings from Last 3 Encounters:  07/18/20 128/82  06/30/20 120/78  05/02/20 120/76

## 2020-07-18 NOTE — Progress Notes (Signed)
Subjective:  Patient ID: Raymond Snow, male    DOB: 02-14-1951  Age: 69 y.o. MRN: [id number]  CC: Diabetes (6 month F/U) and Medication Refill (Pt states he has been cuttig his 10 mg Amlodipine in half, would like a 5 mg rx sent to pharmacy)   HPI Raymond Snow presents for HTN, l SHOULDER PAIN - BETTER, DM  F/U  Outpatient Medications Prior to Visit  Medication Sig Dispense Refill  . amLODipine (NORVASC) 10 MG tablet Take 0.5 tablets (5 mg total) by mouth daily. TAKE (1) TABLET DAILY IN THE MORNING. (Patient taking differently: Take 5 mg by mouth daily. TAKE 1/2 TABLET DAILY IN THE MORNING.) 90 tablet 3  . aspirin 81 MG chewable tablet Chew 81 mg by mouth daily.      . fluticasone (FLONASE) 50 MCG/ACT nasal spray Place 1-2 sprays into both nostrils daily as needed for rhinitis. 48 g 3  . irbesartan (AVAPRO) 300 MG tablet Take 1 tablet (300 mg total) by mouth daily. 90 tablet 3  . loratadine (CLARITIN) 10 MG tablet Take 10 mg by mouth daily.    . montelukast (SINGULAIR) 10 MG tablet Take 1 tablet (10 mg total) by mouth daily. 90 tablet 3  . rosuvastatin (CRESTOR) 5 MG tablet Take 1 tablet (5 mg total) by mouth daily. As directed 90 tablet 3  . valACYclovir (VALTREX) 1000 MG tablet TAKE (1/2) TABLET TWICE DAILY. 90 tablet 3  . zolpidem (AMBIEN) 10 MG tablet 1 po qhs prn 90 tablet 1   No facility-administered medications prior to visit.    ROS: Review of Systems  Constitutional: Negative for appetite change, fatigue and unexpected weight change.  HENT: Negative for congestion, nosebleeds, sneezing, sore throat and trouble swallowing.   Eyes: Negative for itching and visual disturbance.  Respiratory: Negative for cough.   Cardiovascular: Negative for chest pain, palpitations and leg swelling.  Gastrointestinal: Negative for abdominal distention, blood in stool, diarrhea and nausea.  Genitourinary: Negative for frequency and hematuria.  Musculoskeletal: Positive for arthralgias.  Negative for back pain, gait problem, joint swelling and neck pain.  Skin: Negative for rash.  Neurological: Negative for dizziness, tremors, speech difficulty and weakness.  Psychiatric/Behavioral: Negative for agitation, dysphoric mood and sleep disturbance. The patient is not nervous/anxious.     Objective:  BP 128/82 (BP Location: Left Arm)   Pulse 71   Temp 98.1 F (36.7 C) (Oral)   Wt 196 lb 3.2 oz (89 kg)   SpO2 96%   BMI 29.83 kg/m   BP Readings from Last 3 Encounters:  07/18/20 128/82  06/30/20 120/78  05/02/20 120/76    Wt Readings from Last 3 Encounters:  07/18/20 196 lb 3.2 oz (89 kg)  06/30/20 195 lb 9.6 oz (88.7 kg)  05/02/20 190 lb (86.2 kg)    Physical Exam Constitutional:      General: He is not in acute distress.    Appearance: He is well-developed.     Comments: NAD  Eyes:     Conjunctiva/sclera: Conjunctivae normal.     Pupils: Pupils are equal, round, and reactive to light.  Neck:     Thyroid: No thyromegaly.     Vascular: No JVD.  Cardiovascular:     Rate and Rhythm: Normal rate and regular rhythm.     Heart sounds: Normal heart sounds. No murmur heard.  No friction rub. No gallop.   Pulmonary:     Effort: Pulmonary effort is normal. No respiratory distress.  Breath sounds: Normal breath sounds. No wheezing or rales.  Chest:     Chest wall: No tenderness.  Abdominal:     General: Bowel sounds are normal. There is no distension.     Palpations: Abdomen is soft. There is no mass.     Tenderness: There is no abdominal tenderness. There is no guarding or rebound.  Musculoskeletal:        General: No tenderness. Normal range of motion.     Cervical back: Normal range of motion.  Lymphadenopathy:     Cervical: No cervical adenopathy.  Skin:    General: Skin is warm and dry.     Findings: No rash.  Neurological:     Mental Status: He is alert and oriented to person, place, and time.     Cranial Nerves: No cranial nerve deficit.      Motor: No abnormal muscle tone.     Coordination: Coordination normal.     Gait: Gait normal.     Deep Tendon Reflexes: Reflexes are normal and symmetric.  Psychiatric:        Behavior: Behavior normal.        Thought Content: Thought content normal.        Judgment: Judgment normal.     Lab Results  Component Value Date   WBC 7.5 07/01/2020   HGB 14.8 07/01/2020   HCT 43.9 07/01/2020   PLT 189.0 07/01/2020   GLUCOSE 81 07/01/2020   CHOL 163 07/01/2020   TRIG 88.0 07/01/2020   HDL 76.00 07/01/2020   LDLDIRECT 169.7 06/01/2013   LDLCALC 69 07/01/2020   ALT 10 07/01/2020   AST 15 07/01/2020   NA 140 07/01/2020   K 4.9 07/01/2020   CL 104 07/01/2020   CREATININE 0.98 07/01/2020   BUN 14 07/01/2020   CO2 29 07/01/2020   TSH 0.99 07/01/2020   PSA 0.70 07/01/2020   INR 0.97 11/22/2012    CT Chest Wo Contrast  Result Date: 01/26/2020 CLINICAL DATA:  Abnormal chest x-ray. 8 mm right lung nodule. Prior smoker. EXAM: CT CHEST WITHOUT CONTRAST TECHNIQUE: Multidetector CT imaging of the chest was performed following the standard protocol without IV contrast. COMPARISON:  Radiograph 01/14/2020.  Chest CT 12/24/2017 FINDINGS: Cardiovascular: Mild aortic atherosclerosis. No aortic aneurysm. Coronary artery calcifications. Heart is normal in size. No pericardial effusion. Mediastinum/Nodes: No enlarged mediastinal lymph nodes. No evidence of hilar adenopathy. Subcentimeter calcified left thyroid nodule, no dedicated imaging follow-up is needed given size. Tiny hiatal hernia. No esophageal wall thickening. Lungs/Pleura: In the superior segment of the right lower lobe there is a 4 x 8 mm calcified nodule (6 mm mean diameter) on image 3 of series 57. Minimal adjacent scarring in the pulmonary parenchyma. This corresponds to radiographic abnormality, and is unchanged from prior chest CT. Tiny subpleural nodule in the left upper lobe, series 3, image 41, unchanged from 2019 and considered benign. No  suspicious or new pulmonary nodule. No focal airspace disease. No pleural fluid. Minimal retained mucus in the trachea and mainstem bronchi are patent Upper Abdomen: No acute finding. Musculoskeletal: Mild degenerative change in the spine. There are no acute or suspicious osseous abnormalities. IMPRESSION: Nodule in the superior segment of the right lower lobe on radiograph represents a calcified granuloma, benign. No dedicated imaging follow-up is needed. Coronary artery calcifications. Aortic Atherosclerosis (ICD10-I70.0). Electronically Signed   By: Keith Rake M.D.   On: 01/26/2020 20:34    Assessment & Plan:   Walker Kehr, MD

## 2020-07-18 NOTE — Assessment & Plan Note (Signed)
A1c

## 2020-07-18 NOTE — Progress Notes (Addendum)
Subjective:   Raymond Snow is a 69 y.o. male who presents for Medicare Annual/Subsequent preventive examination.  Review of Systems    No ROS. Medicare Wellness Visit. Additional risk factors are reflected in social history. Cardiac Risk Factors include: advanced age (>61men, >33 women);dyslipidemia;hypertension;male gender Sleep Patterns: No sleep issues, feels rested on waking and sleeps 8 hours nightly. Home Safety/Smoke Alarms: Feels safe in home; uses home alarm. Smoke alarms in place. Living environment: 1-story home; Lives with spouse; no needs for DME; good support system. Seat Belt Safety/Bike Helmet: Wears seat belt.    Objective:    Today's Vitals   07/18/20 1108  BP: 128/82  Pulse: 71  Temp: 98.1 F (36.7 C)  SpO2: 98%  Weight: 196 lb 3.4 oz (89 kg)  Height: 5\' 8"  (1.727 m)  PainSc: 0-No pain   Body mass index is 29.83 kg/m.  Advanced Directives 07/18/2020 11/25/2012  Does Patient Have a Medical Advance Directive? Yes Patient has advance directive, copy not in chart  Type of Advance Directive - [place];Living will  Does patient want to make changes to medical advance directive? No - Patient declined -    Current Medications (verified) Outpatient Encounter Medications as of 07/18/2020  Medication Sig   aspirin 81 MG chewable tablet Chew 81 mg by mouth daily.     fluticasone (FLONASE) 50 MCG/ACT nasal spray Place 1-2 sprays into both nostrils daily as needed for rhinitis.   loratadine (CLARITIN) 10 MG tablet Take 10 mg by mouth daily.   valACYclovir (VALTREX) 1000 MG tablet TAKE (1/2) TABLET TWICE DAILY.   [DISCONTINUED] amLODipine (NORVASC) 10 MG tablet Take 0.5 tablets (5 mg total) by mouth daily. TAKE (1) TABLET DAILY IN THE MORNING. (Patient taking differently: Take 5 mg by mouth daily. TAKE 1/2 TABLET DAILY IN THE MORNING.)   [DISCONTINUED] irbesartan (AVAPRO) 300 MG tablet Take 1 tablet (300 mg total) by mouth daily.    [DISCONTINUED] montelukast (SINGULAIR) 10 MG tablet Take 1 tablet (10 mg total) by mouth daily.   [DISCONTINUED] rosuvastatin (CRESTOR) 5 MG tablet Take 1 tablet (5 mg total) by mouth daily. As directed   [DISCONTINUED] zolpidem (AMBIEN) 10 MG tablet 1 po qhs prn   No facility-administered encounter medications on file as of 07/18/2020.    Allergies (verified) Lipitor [atorvastatin]   History: Past Medical History:  Diagnosis Date   Allergy    Diverticulosis    colon 2009 Dr Domenic Moras   Genital herpes    on the leg   H/O hiatal hernia    HTN (hypertension)    Hyperlipidemia    Rosacea    Tinnitus    Past Surgical History:  Procedure Laterality Date   COLONOSCOPY  04/18/2010   at [facility] GI=normal exam    OPEN REDUCTION INTERNAL FIXATION (ORIF) DISTAL RADIAL FRACTURE Left 12/01/2012   Procedure: OPEN REDUCTION INTERNAL FIXATION (ORIF) LEFT DISTAL RADIAL FRACTURE;  Surgeon: Jolyn Nap, MD;  Location: [place];  Service: Orthopedics;  Laterality: Left;   TONSILLECTOMY     Family History  Problem Relation Age of Onset   Diverticulitis Father    Diabetes Father    Diabetes Mother    Diverticulitis Mother    Colon cancer Neg Hx    Colon polyps Neg Hx    Esophageal cancer Neg Hx    Rectal cancer Neg Hx    Stomach cancer Neg Hx    Social History   Socioeconomic History   Marital  status: Married    Spouse name: Not on file   Number of children: Not on file   Years of education: Not on file   Highest education level: Not on file  Occupational History   Occupation: Mudlogger of [employer]   Tobacco Use   Smoking status: Former Smoker    Quit date: 08/20/1986    Years since quitting: 33.9   Smokeless tobacco: Never Used  Vaping Use   Vaping Use: Never used  Substance and Sexual Activity   Alcohol use: Yes    Alcohol/week: 14.0 standard drinks    Types: 14 Standard drinks or equivalent per week   Drug use: No   Sexual activity: Yes  Other  Topics Concern   Not on file  Social History Narrative   Regular exercise - NO         Social Determinants of Health   Financial Resource Strain: Low Risk    Difficulty of Paying Living Expenses: Not hard at all  Food Insecurity: No Food Insecurity   Worried About Charity fundraiser in the Last Year: Never true   Ran Out of Food in the Last Year: Never true  Transportation Needs: No Transportation Needs   Lack of Transportation (Medical): No   Lack of Transportation (Non-Medical): No  Physical Activity: Inactive   Days of Exercise per Week: 0 days   Minutes of Exercise per Session: 0 min  Stress: No Stress Concern Present   Feeling of Stress : Not at all  Social Connections: Socially Integrated   Frequency of Communication with Friends and Family: More than three times a week   Frequency of Social Gatherings with Friends and Family: More than three times a week   Attends Religious Services: More than 4 times per year   Active Member of [employer] or Organizations: Yes   Attends Music therapist: More than 4 times per year   Marital Status: Married    Tobacco Counseling Counseling given: Not Answered   Clinical Intake:  Pre-visit preparation completed: Yes  Pain : No/denies pain Pain Score: 0-No pain     BMI - recorded: 29.83 Nutritional Status: BMI 25 -29 Overweight Nutritional Risks: None Diabetes: No  How often do you need to have someone help you when you read instructions, pamphlets, or other written materials from your doctor or pharmacy?: 1 - Never  Diabetic? no  Interpreter Needed?: No  Information entered by :: Lisette Abu, LPN   Activities of Daily Living In your present state of health, do you have any difficulty performing the following activities: 07/18/2020  Hearing? N  Vision? N  Difficulty concentrating or making decisions? N  Walking or climbing stairs? N  Dressing or bathing? N  Doing errands, shopping? N  Preparing Food  and eating ? N  Using the Toilet? N  In the past six months, have you accidently leaked urine? N  Do you have problems with loss of bowel control? N  Managing your Medications? N  Managing your Finances? N  Housekeeping or managing your Housekeeping? N  Some recent data might be hidden    Patient Care Team: Plotnikov, Evie Lacks, MD as PCP - General  Indicate any recent Medical Services you may have received from other than [facility] providers in the past year (date may be approximate).     Assessment:   This is a routine wellness examination for [facility].  Hearing/Vision screen No exam data present  Dietary issues and exercise activities discussed:  Current Exercise Habits: The patient does not participate in regular exercise at present (does yard work and boating; no regular exercise), Exercise limited by: None identified  Goals       Patient Stated (pt-stated)      To maintain my current weight.       Depression Screen PHQ 2/9 Scores 07/18/2020 03/24/2020 12/19/2017 12/17/2016 12/15/2015  PHQ - 2 Score 0 0 0 0 0    Fall Risk Fall Risk  07/18/2020 03/24/2020 12/19/2017 12/17/2016  Falls in the past year? 0 0 No No  Number falls in past yr: 0 0 - -  Injury with Fall? 0 0 - -  Risk for fall due to : No Fall Risks - - -  Follow up Falls evaluation completed - - -    Any stairs in or around the home? No  If so, are there any without handrails? No  Home free of loose throw rugs in walkways, pet beds, electrical cords, etc? Yes  Adequate lighting in your home to reduce risk of falls? Yes   ASSISTIVE DEVICES UTILIZED TO PREVENT FALLS:  Life alert? No  Use of a cane, walker or w/c? No  Grab bars in the bathroom? Yes  Shower chair or bench in shower? Yes  Elevated toilet seat or a handicapped toilet? Yes   TIMED UP AND GO:  Was the test performed? No .  Length of time to ambulate 10 feet: 0 sec.   Gait steady and fast without use of assistive device  Cognitive Function:      6CIT Screen 07/18/2020  What Year? 0 points  What month? 0 points  What time? 0 points  Count back from 20 0 points  Months in reverse 0 points  Repeat phrase 0 points  Total Score 0    Immunizations Immunization History  Administered Date(s) Administered   Fluad Quad(high Dose 65+) 04/18/2019, 05/06/2020   Influenza Whole 04/18/2010, 06/04/2012   Influenza,inj,Quad PF,6+ Mos 06/08/2013, 06/14/2014, 06/16/2015, 05/25/2016, 05/24/2017   PFIZER SARS-COV-2 Vaccination 09/26/2019, 10/21/2019, 05/14/2020   Pneumococcal Conjugate-13 12/17/2016   Pneumococcal Polysaccharide-23 06/14/2014, 07/20/2019   Td 08/21/2007   Tdap 12/19/2017   Zoster 06/18/2012   Zoster Recombinat (Shingrix) 02/27/2018, 05/15/2018    TDAP status: Up to date Flu Vaccine status: Up to date Pneumococcal vaccine status: Up to date Covid-19 vaccine status: Completed vaccines  Qualifies for Shingles Vaccine? Yes   Zostavax completed Yes   Shingrix Completed?: Yes  Screening Tests Health Maintenance  Topic Date Due   COLONOSCOPY  05/03/2027   TETANUS/TDAP  12/20/2027   INFLUENZA VACCINE  Completed   COVID-19 Vaccine  Completed   Hepatitis C Screening  Completed   PNA vac Low Risk Adult  Completed    Health Maintenance  There are no preventive care reminders to display for this patient.  Colorectal cancer screening: Completed 05/02/2020. Repeat every 7 years  Lung Cancer Screening: (Low Dose CT Chest recommended if Age 68-80 years, 30 pack-year currently smoking OR have quit w/in 15years.) does not qualify.   Lung Cancer Screening Referral: no  Additional Screening:  Hepatitis C Screening: does qualify; Completed yes  Vision Screening: Recommended annual ophthalmology exams for early detection of glaucoma and other disorders of the eye. Is the patient up to date with their annual eye exam?  Yes  Who is the provider or what is the name of the office in which the patient attends annual eye exams?  Katy Apo, MD. If pt is not established  with a provider, would they like to be referred to a provider to establish care? No .   Dental Screening: Recommended annual dental exams for proper oral hygiene  Community Resource Referral / Chronic Care Management: CRR required this visit?  No   CCM required this visit?  No      Plan:     I have personally reviewed and noted the following in the patient's chart:   Medical and social history Use of alcohol, tobacco or illicit drugs  Current medications and supplements Functional ability and status Nutritional status Physical activity Advanced directives List of other physicians Hospitalizations, surgeries, and ER visits in previous 12 months Vitals Screenings to include cognitive, depression, and falls Referrals and appointments  In addition, I have reviewed and discussed with patient certain preventive protocols, quality metrics, and best practice recommendations. A written personalized care plan for preventive services as well as general preventive health recommendations were provided to patient.     Sheral Flow, LPN   [id number]   Nurse Notes: n/a  Medical screening examination/treatment/procedure(s) were performed by non-physician practitioner and as supervising physician I was immediately available for consultation/collaboration.  I agree with above. Lew Dawes, MD

## 2020-08-10 DIAGNOSIS — M67912 Unspecified disorder of synovium and tendon, left shoulder: Secondary | ICD-10-CM | POA: Diagnosis not present

## 2020-08-10 DIAGNOSIS — M25512 Pain in left shoulder: Secondary | ICD-10-CM | POA: Diagnosis not present

## 2020-10-05 DIAGNOSIS — M25512 Pain in left shoulder: Secondary | ICD-10-CM | POA: Diagnosis not present

## 2020-10-05 DIAGNOSIS — M19012 Primary osteoarthritis, left shoulder: Secondary | ICD-10-CM | POA: Diagnosis not present

## 2020-10-11 DIAGNOSIS — M25512 Pain in left shoulder: Secondary | ICD-10-CM | POA: Diagnosis not present

## 2020-10-11 DIAGNOSIS — M67912 Unspecified disorder of synovium and tendon, left shoulder: Secondary | ICD-10-CM | POA: Diagnosis not present

## 2020-10-11 DIAGNOSIS — M6281 Muscle weakness (generalized): Secondary | ICD-10-CM | POA: Diagnosis not present

## 2020-12-27 DIAGNOSIS — L570 Actinic keratosis: Secondary | ICD-10-CM | POA: Diagnosis not present

## 2021-01-17 ENCOUNTER — Encounter: Payer: Self-pay | Admitting: Internal Medicine

## 2021-01-17 ENCOUNTER — Ambulatory Visit (INDEPENDENT_AMBULATORY_CARE_PROVIDER_SITE_OTHER): Payer: Medicare Other | Admitting: Internal Medicine

## 2021-01-17 ENCOUNTER — Other Ambulatory Visit: Payer: Self-pay

## 2021-01-17 DIAGNOSIS — I700 Atherosclerosis of aorta: Secondary | ICD-10-CM | POA: Diagnosis not present

## 2021-01-17 DIAGNOSIS — E785 Hyperlipidemia, unspecified: Secondary | ICD-10-CM | POA: Diagnosis not present

## 2021-01-17 DIAGNOSIS — I2583 Coronary atherosclerosis due to lipid rich plaque: Secondary | ICD-10-CM | POA: Diagnosis not present

## 2021-01-17 DIAGNOSIS — R739 Hyperglycemia, unspecified: Secondary | ICD-10-CM | POA: Diagnosis not present

## 2021-01-17 DIAGNOSIS — R0989 Other specified symptoms and signs involving the circulatory and respiratory systems: Secondary | ICD-10-CM

## 2021-01-17 DIAGNOSIS — I2510 Atherosclerotic heart disease of native coronary artery without angina pectoris: Secondary | ICD-10-CM

## 2021-01-17 DIAGNOSIS — R918 Other nonspecific abnormal finding of lung field: Secondary | ICD-10-CM

## 2021-01-17 DIAGNOSIS — I10 Essential (primary) hypertension: Secondary | ICD-10-CM | POA: Diagnosis not present

## 2021-01-17 LAB — COMPREHENSIVE METABOLIC PANEL
ALT: 16 U/L (ref 0–53)
AST: 19 U/L (ref 0–37)
Albumin: 4.4 g/dL (ref 3.5–5.2)
Alkaline Phosphatase: 79 U/L (ref 39–117)
BUN: 12 mg/dL (ref 6–23)
CO2: 27 mEq/L (ref 19–32)
Calcium: 10 mg/dL (ref 8.4–10.5)
Chloride: 102 mEq/L (ref 96–112)
Creatinine, Ser: 0.99 mg/dL (ref 0.40–1.50)
GFR: 77.68 mL/min (ref >60.00)
Glucose, Bld: 89 mg/dL (ref 70–99)
Potassium: 4.8 mEq/L (ref 3.5–5.1)
Sodium: 138 mEq/L (ref 135–145)
Total Bilirubin: 0.7 mg/dL (ref 0.2–1.2)
Total Protein: 7.3 g/dL (ref 6.0–8.3)

## 2021-01-17 LAB — LIPID PANEL
Cholesterol: 181 mg/dL (ref 0–200)
HDL: 77.9 mg/dL (ref >39.00)
LDL Cholesterol: 90 mg/dL (ref 0–99)
NonHDL: 103.45
Total CHOL/HDL Ratio: 2
Triglycerides: 67 mg/dL (ref 0.0–149.0)
VLDL: 13.4 mg/dL (ref 0.0–40.0)

## 2021-01-17 LAB — HEMOGLOBIN A1C: Hgb A1c MFr Bld: 5.5 % (ref 4.6–6.5)

## 2021-01-17 MED ORDER — ROSUVASTATIN CALCIUM 5 MG PO TABS: 5.0000 mg | ORAL_TABLET | Freq: Every day | ORAL | 3 refills | Status: DC

## 2021-01-17 MED ORDER — MONTELUKAST SODIUM 10 MG PO TABS: 10.0000 mg | ORAL_TABLET | Freq: Every day | ORAL | 3 refills | Status: DC

## 2021-01-17 MED ORDER — FLUTICASONE PROPIONATE 50 MCG/ACT NA SUSP
1.0000 | Freq: Every day | NASAL | 3 refills | Status: DC | PRN
Start: 2021-01-17 — End: 2022-04-09

## 2021-01-17 MED ORDER — ZOLPIDEM TARTRATE 10 MG PO TABS: ORAL_TABLET | ORAL | 1 refills | Status: DC

## 2021-01-17 MED ORDER — AMLODIPINE BESYLATE 5 MG PO TABS: 5.0000 mg | ORAL_TABLET | Freq: Every day | ORAL | 3 refills | Status: DC

## 2021-01-17 MED ORDER — IRBESARTAN 300 MG PO TABS
300.0000 mg | ORAL_TABLET | Freq: Every day | ORAL | 3 refills | Status: DC
Start: 2021-01-17 — End: 2022-03-30

## 2021-01-17 NOTE — Assessment & Plan Note (Signed)
On Crestor 

## 2021-01-17 NOTE — Assessment & Plan Note (Signed)
6/21 Chest CT IMPRESSION: Nodule in the superior segment of the right lower lobe on radiograph represents a calcified granuloma, benign. No dedicated imaging follow-up is needed.

## 2021-01-17 NOTE — Assessment & Plan Note (Signed)
On Crestor, ASA 

## 2021-01-17 NOTE — Assessment & Plan Note (Signed)
Norvasc Valsartan

## 2021-01-17 NOTE — Assessment & Plan Note (Addendum)
Raymond Snow

## 2021-01-17 NOTE — Progress Notes (Signed)
Subjective:  Patient ID: Raymond Snow, male    DOB: [birth year]  Age: 70 y.o. MRN: [id number]  CC: Follow-up (6 month f/u- Refill on all meds)   HPI Raymond Snow presents for HTN, allergies, dyslipidemia  Outpatient Medications Prior to Visit  Medication Sig Dispense Refill  . aspirin 81 MG chewable tablet Chew 81 mg by mouth daily.    Marland Kitchen loratadine (CLARITIN) 10 MG tablet Take 10 mg by mouth daily.    . valACYclovir (VALTREX) 1000 MG tablet TAKE (1/2) TABLET TWICE DAILY. 90 tablet 3  . amLODipine (NORVASC) 5 MG tablet Take 1 tablet (5 mg total) by mouth daily. 90 tablet 3  . fluticasone (FLONASE) 50 MCG/ACT nasal spray Place 1-2 sprays into both nostrils daily as needed for rhinitis. 48 g 3  . irbesartan (AVAPRO) 300 MG tablet Take 1 tablet (300 mg total) by mouth daily. 90 tablet 3  . montelukast (SINGULAIR) 10 MG tablet Take 1 tablet (10 mg total) by mouth daily. 90 tablet 3  . rosuvastatin (CRESTOR) 5 MG tablet Take 1 tablet (5 mg total) by mouth daily. As directed 90 tablet 3  . zolpidem (AMBIEN) 10 MG tablet 1 po qhs prn 90 tablet 1   No facility-administered medications prior to visit.    ROS: Review of Systems  Constitutional: Negative for appetite change, fatigue and unexpected weight change.  HENT: Negative for congestion, nosebleeds, sneezing, sore throat and trouble swallowing.   Eyes: Negative for itching and visual disturbance.  Respiratory: Negative for cough.   Cardiovascular: Negative for chest pain, palpitations and leg swelling.  Gastrointestinal: Negative for abdominal distention, blood in stool, diarrhea and nausea.  Genitourinary: Negative for frequency and hematuria.  Musculoskeletal: Negative for back pain, gait problem, joint swelling and neck pain.  Skin: Negative for rash.  Neurological: Negative for dizziness, tremors, speech difficulty and weakness.  Psychiatric/Behavioral: Negative for agitation, dysphoric mood and sleep disturbance. The patient  is not nervous/anxious.     Objective:  BP 122/80 (BP Location: Left Arm)   Pulse 80   Temp 98.5 F (36.9 C) (Oral)   Ht 5\' 8"  (1.727 m)   Wt 191 lb (86.6 kg)   SpO2 97%   BMI 29.04 kg/m   BP Readings from Last 3 Encounters:  01/17/21 122/80  07/18/20 128/82  07/18/20 128/82    Wt Readings from Last 3 Encounters:  01/17/21 191 lb (86.6 kg)  07/18/20 196 lb 3.4 oz (89 kg)  07/18/20 196 lb 3.2 oz (89 kg)    Physical Exam Constitutional:      General: He is not in acute distress.    Appearance: He is well-developed.     Comments: NAD  Eyes:     Conjunctiva/sclera: Conjunctivae normal.     Pupils: Pupils are equal, round, and reactive to light.  Neck:     Thyroid: No thyromegaly.     Vascular: No JVD.  Cardiovascular:     Rate and Rhythm: Normal rate and regular rhythm.     Heart sounds: Normal heart sounds. No murmur heard. No friction rub. No gallop.   Pulmonary:     Effort: Pulmonary effort is normal. No respiratory distress.     Breath sounds: Normal breath sounds. No wheezing or rales.  Chest:     Chest wall: No tenderness.  Abdominal:     General: Bowel sounds are normal. There is no distension.     Palpations: Abdomen is soft. There is no mass.  Tenderness: There is no abdominal tenderness. There is no guarding or rebound.  Musculoskeletal:        General: No tenderness. Normal range of motion.     Cervical back: Normal range of motion.  Lymphadenopathy:     Cervical: No cervical adenopathy.  Skin:    General: Skin is warm and dry.     Findings: No rash.  Neurological:     Mental Status: He is alert and oriented to person, place, and time.     Cranial Nerves: No cranial nerve deficit.     Motor: No abnormal muscle tone.     Coordination: Coordination normal.     Gait: Gait normal.     Deep Tendon Reflexes: Reflexes are normal and symmetric.  Psychiatric:        Behavior: Behavior normal.        Thought Content: Thought content normal.         Judgment: Judgment normal.   mild bruit B  Lab Results  Component Value Date   WBC 7.5 07/01/2020   HGB 14.8 07/01/2020   HCT 43.9 07/01/2020   PLT 189.0 07/01/2020   GLUCOSE 81 07/01/2020   CHOL 163 07/01/2020   TRIG 88.0 07/01/2020   HDL 76.00 07/01/2020   LDLDIRECT 169.7 06/01/2013   LDLCALC 69 07/01/2020   ALT 10 07/01/2020   AST 15 07/01/2020   NA 140 07/01/2020   K 4.9 07/01/2020   CL 104 07/01/2020   CREATININE 0.98 07/01/2020   BUN 14 07/01/2020   CO2 29 07/01/2020   TSH 0.99 07/01/2020   PSA 0.70 07/01/2020   INR 0.97 11/22/2012    CT Chest Wo Contrast  Result Date: 01/26/2020 CLINICAL DATA:  Abnormal chest x-ray. 8 mm right lung nodule. Prior smoker. EXAM: CT CHEST WITHOUT CONTRAST TECHNIQUE: Multidetector CT imaging of the chest was performed following the standard protocol without IV contrast. COMPARISON:  Radiograph 01/14/2020.  Chest CT 12/24/2017 FINDINGS: Cardiovascular: Mild aortic atherosclerosis. No aortic aneurysm. Coronary artery calcifications. Heart is normal in size. No pericardial effusion. Mediastinum/Nodes: No enlarged mediastinal lymph nodes. No evidence of hilar adenopathy. Subcentimeter calcified left thyroid nodule, no dedicated imaging follow-up is needed given size. Tiny hiatal hernia. No esophageal wall thickening. Lungs/Pleura: In the superior segment of the right lower lobe there is a 4 x 8 mm calcified nodule (6 mm mean diameter) on image 3 of series 57. Minimal adjacent scarring in the pulmonary parenchyma. This corresponds to radiographic abnormality, and is unchanged from prior chest CT. Tiny subpleural nodule in the left upper lobe, series 3, image 41, unchanged from 2019 and considered benign. No suspicious or new pulmonary nodule. No focal airspace disease. No pleural fluid. Minimal retained mucus in the trachea and mainstem bronchi are patent Upper Abdomen: No acute finding. Musculoskeletal: Mild degenerative change in the spine. There are no  acute or suspicious osseous abnormalities. IMPRESSION: Nodule in the superior segment of the right lower lobe on radiograph represents a calcified granuloma, benign. No dedicated imaging follow-up is needed. Coronary artery calcifications. Aortic Atherosclerosis (ICD10-I70.0). Electronically Signed   By: Keith Rake M.D.   On: 01/26/2020 20:34    Assessment & Plan:   There are no diagnoses linked to this encounter.   Meds ordered this encounter  Medications  . amLODipine (NORVASC) 5 MG tablet    Sig: Take 1 tablet (5 mg total) by mouth daily.    Dispense:  90 tablet    Refill:  3  . fluticasone (FLONASE)  50 MCG/ACT nasal spray    Sig: Place 1-2 sprays into both nostrils daily as needed for rhinitis.    Dispense:  48 g    Refill:  3  . irbesartan (AVAPRO) 300 MG tablet    Sig: Take 1 tablet (300 mg total) by mouth daily.    Dispense:  90 tablet    Refill:  3  . montelukast (SINGULAIR) 10 MG tablet    Sig: Take 1 tablet (10 mg total) by mouth daily.    Dispense:  90 tablet    Refill:  3  . rosuvastatin (CRESTOR) 5 MG tablet    Sig: Take 1 tablet (5 mg total) by mouth daily.    Dispense:  90 tablet    Refill:  3  . zolpidem (AMBIEN) 10 MG tablet    Sig: 1 po qhs prn    Dispense:  90 tablet    Refill:  1     Follow-up: No follow-ups on file.  Walker Kehr, MD

## 2021-01-26 DIAGNOSIS — L570 Actinic keratosis: Secondary | ICD-10-CM | POA: Diagnosis not present

## 2021-02-01 ENCOUNTER — Ambulatory Visit
Admission: RE | Admit: 2021-02-01 | Discharge: 2021-02-01 | Disposition: A | Discharge status: Home / Self Care | Payer: Medicare Other | Source: Ambulatory Visit | Attending: Internal Medicine | Admitting: Internal Medicine

## 2021-02-01 ENCOUNTER — Other Ambulatory Visit: Payer: Self-pay

## 2021-02-01 DIAGNOSIS — E782 Mixed hyperlipidemia: Secondary | ICD-10-CM | POA: Diagnosis not present

## 2021-02-01 DIAGNOSIS — R0989 Other specified symptoms and signs involving the circulatory and respiratory systems: Secondary | ICD-10-CM | POA: Diagnosis not present

## 2021-02-01 DIAGNOSIS — I10 Essential (primary) hypertension: Secondary | ICD-10-CM | POA: Diagnosis not present

## 2021-02-01 DIAGNOSIS — I6523 Occlusion and stenosis of bilateral carotid arteries: Secondary | ICD-10-CM | POA: Diagnosis not present

## 2021-02-15 DIAGNOSIS — M7661 Achilles tendinitis, right leg: Secondary | ICD-10-CM | POA: Diagnosis not present

## 2021-02-23 ENCOUNTER — Other Ambulatory Visit: Payer: Self-pay | Admitting: Internal Medicine

## 2021-03-09 DIAGNOSIS — L570 Actinic keratosis: Secondary | ICD-10-CM | POA: Diagnosis not present

## 2021-04-17 DIAGNOSIS — L570 Actinic keratosis: Secondary | ICD-10-CM | POA: Diagnosis not present

## 2021-04-17 DIAGNOSIS — L820 Inflamed seborrheic keratosis: Secondary | ICD-10-CM | POA: Diagnosis not present

## 2021-05-08 ENCOUNTER — Ambulatory Visit (INDEPENDENT_AMBULATORY_CARE_PROVIDER_SITE_OTHER): Payer: Medicare Other

## 2021-05-08 ENCOUNTER — Other Ambulatory Visit: Payer: Self-pay

## 2021-05-08 DIAGNOSIS — Z23 Encounter for immunization: Secondary | ICD-10-CM | POA: Diagnosis not present

## 2021-06-12 DIAGNOSIS — Z961 Presence of intraocular lens: Secondary | ICD-10-CM | POA: Diagnosis not present

## 2021-06-12 DIAGNOSIS — H5212 Myopia, left eye: Secondary | ICD-10-CM | POA: Diagnosis not present

## 2021-07-19 ENCOUNTER — Ambulatory Visit: Payer: Medicare Other | Admitting: Internal Medicine

## 2021-07-20 ENCOUNTER — Encounter: Payer: Self-pay | Admitting: Internal Medicine

## 2021-07-20 ENCOUNTER — Ambulatory Visit (INDEPENDENT_AMBULATORY_CARE_PROVIDER_SITE_OTHER): Payer: Medicare Other | Admitting: Internal Medicine

## 2021-07-20 ENCOUNTER — Other Ambulatory Visit: Payer: Self-pay

## 2021-07-20 ENCOUNTER — Ambulatory Visit (INDEPENDENT_AMBULATORY_CARE_PROVIDER_SITE_OTHER): Payer: Medicare Other

## 2021-07-20 VITALS — BP 120/76 | HR 58 | Temp 98.0°F | Ht 68.0 in | Wt 199.2 lb

## 2021-07-20 DIAGNOSIS — I10 Essential (primary) hypertension: Secondary | ICD-10-CM

## 2021-07-20 DIAGNOSIS — R739 Hyperglycemia, unspecified: Secondary | ICD-10-CM

## 2021-07-20 DIAGNOSIS — R911 Solitary pulmonary nodule: Secondary | ICD-10-CM | POA: Diagnosis not present

## 2021-07-20 DIAGNOSIS — I2510 Atherosclerotic heart disease of native coronary artery without angina pectoris: Secondary | ICD-10-CM

## 2021-07-20 DIAGNOSIS — I2583 Coronary atherosclerosis due to lipid rich plaque: Secondary | ICD-10-CM | POA: Diagnosis not present

## 2021-07-20 DIAGNOSIS — J301 Allergic rhinitis due to pollen: Secondary | ICD-10-CM

## 2021-07-20 LAB — COMPREHENSIVE METABOLIC PANEL
ALT: 15 U/L (ref 0–53)
AST: 21 U/L (ref 0–37)
Albumin: 4.5 g/dL (ref 3.5–5.2)
Alkaline Phosphatase: 84 U/L (ref 39–117)
BUN: 12 mg/dL (ref 6–23)
CO2: 30 mEq/L (ref 19–32)
Calcium: 9.8 mg/dL (ref 8.4–10.5)
Chloride: 103 mEq/L (ref 96–112)
Creatinine, Ser: 1 mg/dL (ref 0.40–1.50)
GFR: 76.48 mL/min (ref >60.00)
Glucose, Bld: 89 mg/dL (ref 70–99)
Potassium: 4.4 mEq/L (ref 3.5–5.1)
Sodium: 138 mEq/L (ref 135–145)
Total Bilirubin: 0.9 mg/dL (ref 0.2–1.2)
Total Protein: 7.5 g/dL (ref 6.0–8.3)

## 2021-07-20 LAB — HEMOGLOBIN A1C: Hgb A1c MFr Bld: 5.4 % (ref 4.6–6.5)

## 2021-07-20 MED ORDER — METHYLPREDNISOLONE ACETATE 80 MG/ML IJ SUSP
80.0000 mg | Freq: Once | INTRAMUSCULAR | Status: AC
  Administered 2021-07-20: 80 mg via INTRAMUSCULAR

## 2021-07-20 NOTE — Progress Notes (Signed)
Subjective:  Patient ID: Raymond Snow, male    DOB: 1951/04/12  Age: 70 y.o. MRN: [id number]  CC: Follow-up (6 month f/u)   HPIdyslipidemia Raymond Snow presents for HTN, allergies, dyslipidemia  Outpatient Medications Prior to Visit  Medication Sig Dispense Refill   amLODipine (NORVASC) 5 MG tablet Take 1 tablet (5 mg total) by mouth daily. 90 tablet 3   aspirin 81 MG chewable tablet Chew 81 mg by mouth daily.     fluticasone (FLONASE) 50 MCG/ACT nasal spray Place 1-2 sprays into both nostrils daily as needed for rhinitis. 48 g 3   irbesartan (AVAPRO) 300 MG tablet Take 1 tablet (300 mg total) by mouth daily. 90 tablet 3   loratadine (CLARITIN) 10 MG tablet Take 10 mg by mouth daily.     montelukast (SINGULAIR) 10 MG tablet Take 1 tablet (10 mg total) by mouth daily. 90 tablet 3   rosuvastatin (CRESTOR) 5 MG tablet Take 1 tablet (5 mg total) by mouth daily. 90 tablet 3   valACYclovir (VALTREX) 500 MG tablet TAKE 1 TABLET BY MOUTH TWICE DAILY. 180 tablet 1   zolpidem (AMBIEN) 10 MG tablet 1 po qhs prn 90 tablet 1   No facility-administered medications prior to visit.    ROS: Review of Systems  Constitutional:  Negative for appetite change, fatigue and unexpected weight change.  HENT:  Negative for congestion, nosebleeds, sneezing, sore throat and trouble swallowing.   Eyes:  Negative for itching and visual disturbance.  Respiratory:  Negative for cough.   Cardiovascular:  Negative for chest pain, palpitations and leg swelling.  Gastrointestinal:  Negative for abdominal distention, blood in stool, diarrhea and nausea.  Genitourinary:  Negative for frequency and hematuria.  Musculoskeletal:  Negative for back pain, gait problem, joint swelling and neck pain.  Skin:  Negative for rash.  Neurological:  Negative for dizziness, tremors, speech difficulty and weakness.  Psychiatric/Behavioral:  Negative for agitation, dysphoric mood and sleep disturbance. The patient is not  nervous/anxious.    Objective:  BP 120/76 (BP Location: Left Arm)   Pulse (!) 58   Temp 98 F (36.7 C) (Oral)   Ht 5\' 8"  (1.727 m)   Wt 199 lb 3.2 oz (90.4 kg)   SpO2 98%   BMI 30.29 kg/m   BP Readings from Last 3 Encounters:  07/20/21 120/76  01/17/21 122/80  07/18/20 128/82    Wt Readings from Last 3 Encounters:  07/20/21 199 lb 3.2 oz (90.4 kg)  01/17/21 191 lb (86.6 kg)  07/18/20 196 lb 3.4 oz (89 kg)    Physical Exam Constitutional:      General: He is not in acute distress.    Appearance: He is well-developed.     Comments: NAD  Eyes:     Conjunctiva/sclera: Conjunctivae normal.     Pupils: Pupils are equal, round, and reactive to light.  Neck:     Thyroid: No thyromegaly.     Vascular: No JVD.  Cardiovascular:     Rate and Rhythm: Normal rate and regular rhythm.     Heart sounds: Normal heart sounds. No murmur heard.   No friction rub. No gallop.  Pulmonary:     Effort: Pulmonary effort is normal. No respiratory distress.     Breath sounds: Normal breath sounds. No wheezing or rales.  Chest:     Chest wall: No tenderness.  Abdominal:     General: Bowel sounds are normal. There is no distension.     Palpations:  Abdomen is soft. There is no mass.     Tenderness: There is no abdominal tenderness. There is no guarding or rebound.  Musculoskeletal:        General: No tenderness. Normal range of motion.     Cervical back: Normal range of motion.  Lymphadenopathy:     Cervical: No cervical adenopathy.  Skin:    General: Skin is warm and dry.     Findings: No rash.  Neurological:     Mental Status: He is alert and oriented to person, place, and time.     Cranial Nerves: No cranial nerve deficit.     Motor: No abnormal muscle tone.     Coordination: Coordination normal.     Gait: Gait normal.     Deep Tendon Reflexes: Reflexes are normal and symmetric.  Psychiatric:        Behavior: Behavior normal.        Thought Content: Thought content normal.         Judgment: Judgment normal.    Lab Results  Component Value Date   WBC 7.5 07/01/2020   HGB 14.8 07/01/2020   HCT 43.9 07/01/2020   PLT 189.0 07/01/2020   GLUCOSE 89 01/17/2021   CHOL 181 01/17/2021   TRIG 67.0 01/17/2021   HDL 77.90 01/17/2021   LDLDIRECT 169.7 06/01/2013   LDLCALC 90 01/17/2021   ALT 16 01/17/2021   AST 19 01/17/2021   NA 138 01/17/2021   K 4.8 01/17/2021   CL 102 01/17/2021   CREATININE 0.99 01/17/2021   BUN 12 01/17/2021   CO2 27 01/17/2021   TSH 0.99 07/01/2020   PSA 0.70 07/01/2020   INR 0.97 11/22/2012   HGBA1C 5.5 01/17/2021    US Carotid Bilateral  Result Date: 02/01/2021 CLINICAL DATA:  Bruit. Hypertension, hyperlipidemia, previous tobacco abuse. EXAM: BILATERAL CAROTID DUPLEX ULTRASOUND TECHNIQUE: Pearline Cables scale imaging, color Doppler and duplex ultrasound were performed of bilateral carotid and vertebral arteries in the neck. COMPARISON:  None. FINDINGS: Criteria: Quantification of carotid stenosis is based on velocity parameters that correlate the residual internal carotid diameter with NASCET-based stenosis levels, using the diameter of the distal internal carotid lumen as the denominator for stenosis measurement. The following velocity measurements were obtained: RIGHT ICA: 93/17 cm/sec CCA: 245/80 cm/sec SYSTOLIC ICA/CCA RATIO:  0.8 ECA: 149/23 cm/sec LEFT ICA: 106/22 cm/sec CCA: 998/33 cm/sec SYSTOLIC ICA/CCA RATIO:  0.9 ECA: 119/19 cm/sec RIGHT CAROTID ARTERY: Smooth plaque in the carotid bulb and ICA origin with only mild stenosis. Normal waveforms and color Doppler signal throughout. Moderate ICA tortuosity. RIGHT VERTEBRAL ARTERY:  Normal flow direction and waveform. LEFT CAROTID ARTERY: Calcified plaque in the mid common carotid without stenosis. Eccentric partially calcified plaque in the bulb and ICA origin with only mild stenosis. Moderate tortuosity of the distal ICA. Normal waveforms and color Doppler signal throughout. LEFT VERTEBRAL ARTERY:   Normal flow direction and waveform. Upper extremity blood pressures: RIGHT: 151/81 LEFT: 139/78 IMPRESSION: 1. Bilateral carotid bifurcation plaque resulting in less than 50% diameter ICA stenosis. 2. Antegrade bilateral vertebral arterial flow. Electronically Signed   By: Lucrezia Europe M.D.   On: 02/01/2021 16:30    Assessment & Plan:   Problem List Items Addressed This Visit     Allergic rhinitis    Worse Cont on Claritin and Flonase, Singulair, sinus rinse Depo-medrol IM      Coronary atherosclerosis    Tolerating Crestor OK qd      Essential hypertension - Primary  Cont on Norvasc, Valsartan      Relevant Orders   Comprehensive metabolic panel   Hyperglycemia   Relevant Orders   Hemoglobin A1c   Pulmonary nodule    Repeat CXR      Relevant Orders   DG Chest 2 View      No orders of the defined types were placed in this encounter.     Follow-up: Return in about 6 months (around 01/18/2022) for Wellness Exam.  Walker Kehr, MD

## 2021-07-20 NOTE — Addendum Note (Signed)
Addended by: Earnstine Regal on: 07/20/2021 02:19 PM   Modules accepted: Orders

## 2021-07-20 NOTE — Assessment & Plan Note (Signed)
Worse Cont on Claritin and Flonase, Singulair, sinus rinse Depo-medrol IM

## 2021-07-20 NOTE — Assessment & Plan Note (Signed)
Repeat CXR

## 2021-07-20 NOTE — Assessment & Plan Note (Addendum)
Tolerating Crestor OK qd

## 2021-07-20 NOTE — Progress Notes (Signed)
COSIGN FOR DEPO...Raymond Snow

## 2021-07-21 ENCOUNTER — Encounter: Payer: Self-pay | Admitting: Internal Medicine

## 2021-07-21 MED ORDER — VALACYCLOVIR HCL 500 MG PO TABS: 500.0000 mg | ORAL_TABLET | Freq: Two times a day (BID) | ORAL | 1 refills | Status: DC

## 2021-07-21 NOTE — Telephone Encounter (Signed)
Pls advise renewal on the Ambien.Marland KitchenJohny Chess

## 2021-07-23 MED ORDER — ZOLPIDEM TARTRATE 10 MG PO TABS: ORAL_TABLET | ORAL | 1 refills | Status: DC

## 2021-08-17 DIAGNOSIS — L570 Actinic keratosis: Secondary | ICD-10-CM | POA: Diagnosis not present

## 2021-08-17 DIAGNOSIS — L821 Other seborrheic keratosis: Secondary | ICD-10-CM | POA: Diagnosis not present

## 2021-08-17 DIAGNOSIS — L814 Other melanin hyperpigmentation: Secondary | ICD-10-CM | POA: Diagnosis not present

## 2021-08-17 DIAGNOSIS — D225 Melanocytic nevi of trunk: Secondary | ICD-10-CM | POA: Diagnosis not present

## 2021-08-23 NOTE — Telephone Encounter (Signed)
Patient states he received an injection for seasonal allergies on 01-17-2021 ov  Patient does not know the name of the medication he received at the ov, but states he was told by provider at visit if allergies flare up again he could receive another injection  I was unable to locate an injection given at his 01-17-2021 ov  Offered patient an appt, patient declined stating he "wants to know the name of the medication first"  Patient is requesting a return mychart message with the name of the medication

## 2021-08-23 NOTE — Telephone Encounter (Signed)
Per chart pt had a depo medrol 80 mg injection back in December not on May 31. To received Depo Medrol 80 mg pt must have appt w/ th provider.Marland KitchenJohny Chess

## 2021-08-25 ENCOUNTER — Ambulatory Visit: Payer: Medicare Other

## 2021-08-30 ENCOUNTER — Encounter: Payer: Self-pay | Admitting: Internal Medicine

## 2021-09-04 ENCOUNTER — Ambulatory Visit (INDEPENDENT_AMBULATORY_CARE_PROVIDER_SITE_OTHER): Payer: Medicare Other

## 2021-09-04 DIAGNOSIS — Z0000 Encounter for general adult medical examination without abnormal findings: Secondary | ICD-10-CM

## 2021-09-04 NOTE — Patient Instructions (Signed)
[name] , Thank you for taking time to come for your Medicare Wellness Visit. I appreciate your ongoing commitment to your health goals. Please review the following plan we discussed and let me know if I can assist you in the future.   Screening recommendations/referrals: Colonoscopy: 05/02/2020  due 2028 Recommended yearly ophthalmology/optometry visit for glaucoma screening and checkup Recommended yearly dental visit for hygiene and checkup  Vaccinations: Influenza vaccine: completed  Pneumococcal vaccine: completed  Tdap vaccine: 12/19/2017 Shingles vaccine: completed     Advanced directives: yes   Conditions/risks identified: none   Next appointment: none   Preventive Care 37 Years and Older, Male Preventive care refers to lifestyle choices and visits with your health care provider that can promote health and wellness. What does preventive care include? A yearly physical exam. This is also called an annual well check. Dental exams once or twice a year. Routine eye exams. Ask your health care provider how often you should have your eyes checked. Personal lifestyle choices, including: Daily care of your teeth and gums. Regular physical activity. Eating a healthy diet. Avoiding tobacco and drug use. Limiting alcohol use. Practicing safe sex. Taking low doses of aspirin every day. Taking vitamin and mineral supplements as recommended by your health care provider. What happens during an annual well check? The services and screenings done by your health care provider during your annual well check will depend on your age, overall health, lifestyle risk factors, and family history of disease. Counseling  Your health care provider may ask you questions about your: Alcohol use. Tobacco use. Drug use. Emotional well-being. Home and relationship well-being. Sexual activity. Eating habits. History of falls. Memory and ability to understand (cognition). Work and work  Statistician. Screening  You may have the following tests or measurements: Height, weight, and BMI. Blood pressure. Lipid and cholesterol levels. These may be checked every 5 years, or more frequently if you are over 46 years old. Skin check. Lung cancer screening. You may have this screening every year starting at age 68 if you have a 30-pack-year history of smoking and currently smoke or have quit within the past 15 years. Fecal occult blood test (FOBT) of the stool. You may have this test every year starting at age 67. Flexible sigmoidoscopy or colonoscopy. You may have a sigmoidoscopy every 5 years or a colonoscopy every 10 years starting at age 41. Prostate cancer screening. Recommendations will vary depending on your family history and other risks. Hepatitis C blood test. Hepatitis B blood test. Sexually transmitted disease (STD) testing. Diabetes screening. This is done by checking your blood sugar (glucose) after you have not eaten for a while (fasting). You may have this done every 1-3 years. Abdominal aortic aneurysm (AAA) screening. You may need this if you are a current or former smoker. Osteoporosis. You may be screened starting at age 73 if you are at high risk. Talk with your health care provider about your test results, treatment options, and if necessary, the need for more tests. Vaccines  Your health care provider may recommend certain vaccines, such as: Influenza vaccine. This is recommended every year. Tetanus, diphtheria, and acellular pertussis (Tdap, Td) vaccine. You may need a Td booster every 10 years. Zoster vaccine. You may need this after age 93. Pneumococcal 13-valent conjugate (PCV13) vaccine. One dose is recommended after age 8. Pneumococcal polysaccharide (PPSV23) vaccine. One dose is recommended after age 29. Talk to your health care provider about which screenings and vaccines you need and how  often you need them. This information is not intended to replace  advice given to you by your health care provider. Make sure you discuss any questions you have with your health care provider. Document Released: 09/02/2015 Document Revised: 04/25/2016 Document Reviewed: 06/07/2015 Elsevier Interactive Patient Education  2017 [place] Prevention in the Home Falls can cause injuries. They can happen to people of all ages. There are many things you can do to make your home safe and to help prevent falls. What can I do on the outside of my home? Regularly fix the edges of walkways and driveways and fix any cracks. Remove anything that might make you trip as you walk through a door, such as a raised step or threshold. Trim any bushes or trees on the path to your home. Use bright outdoor lighting. Clear any walking paths of anything that might make someone trip, such as rocks or tools. Regularly check to see if handrails are loose or broken. Make sure that both sides of any steps have handrails. Any raised decks and porches should have guardrails on the edges. Have any leaves, snow, or ice cleared regularly. Use sand or salt on walking paths during winter. Clean up any spills in your garage right away. This includes oil or grease spills. What can I do in the bathroom? Use night lights. Install grab bars by the toilet and in the tub and shower. Do not use towel bars as grab bars. Use non-skid mats or decals in the tub or shower. If you need to sit down in the shower, use a plastic, non-slip stool. Keep the floor dry. Clean up any water that spills on the floor as soon as it happens. Remove soap buildup in the tub or shower regularly. Attach bath mats securely with double-sided non-slip rug tape. Do not have throw rugs and other things on the floor that can make you trip. What can I do in the bedroom? Use night lights. Make sure that you have a light by your bed that is easy to reach. Do not use any sheets or blankets that are too big for your bed.  They should not hang down onto the floor. Have a firm chair that has side arms. You can use this for support while you get dressed. Do not have throw rugs and other things on the floor that can make you trip. What can I do in the kitchen? Clean up any spills right away. Avoid walking on wet floors. Keep items that you use a lot in easy-to-reach places. If you need to reach something above you, use a strong step stool that has a grab bar. Keep electrical cords out of the way. Do not use floor polish or wax that makes floors slippery. If you must use wax, use non-skid floor wax. Do not have throw rugs and other things on the floor that can make you trip. What can I do with my stairs? Do not leave any items on the stairs. Make sure that there are handrails on both sides of the stairs and use them. Fix handrails that are broken or loose. Make sure that handrails are as long as the stairways. Check any carpeting to make sure that it is firmly attached to the stairs. Fix any carpet that is loose or worn. Avoid having throw rugs at the top or bottom of the stairs. If you do have throw rugs, attach them to the floor with carpet tape. Make sure that you have a  light switch at the top of the stairs and the bottom of the stairs. If you do not have them, ask someone to add them for you. What else can I do to help prevent falls? Wear shoes that: Do not have high heels. Have rubber bottoms. Are comfortable and fit you well. Are closed at the toe. Do not wear sandals. If you use a stepladder: Make sure that it is fully opened. Do not climb a closed stepladder. Make sure that both sides of the stepladder are locked into place. Ask someone to hold it for you, if possible. Clearly mark and make sure that you can see: Any grab bars or handrails. First and last steps. Where the edge of each step is. Use tools that help you move around (mobility aids) if they are needed. These  include: Canes. Walkers. Scooters. Crutches. Turn on the lights when you go into a dark area. Replace any light bulbs as soon as they burn out. Set up your furniture so you have a clear path. Avoid moving your furniture around. If any of your floors are uneven, fix them. If there are any pets around you, be aware of where they are. Review your medicines with your doctor. Some medicines can make you feel dizzy. This can increase your chance of falling. Ask your doctor what other things that you can do to help prevent falls. This information is not intended to replace advice given to you by your health care provider. Make sure you discuss any questions you have with your health care provider. Document Released: 06/02/2009 Document Revised: 01/12/2016 Document Reviewed: 09/10/2014 Elsevier Interactive Patient Education  2017 [employer].

## 2021-09-04 NOTE — Progress Notes (Addendum)
Subjective:   Raymond Snow is a 71 y.o. male who presents for an Subsequent  Medicare Annual Wellness Visit.  I connected with Raymond Snow  today by telephone and verified that I am speaking with the correct person using two identifiers. Location patient: home Location provider: work Persons participating in the virtual visit: patient, provider.   I discussed the limitations, risks, security and privacy concerns of performing an evaluation and management service by telephone and the availability of in person appointments. I also discussed with the patient that there may be a patient responsible charge related to this service. The patient expressed understanding and verbally consented to this telephonic visit.    Interactive audio and video telecommunications were attempted between this provider and patient, however failed, due to patient having technical difficulties OR patient did not have access to video capability.  We continued and completed visit with audio only.    Review of Systems     Cardiac Risk Factors include: advanced age (>13men, >55 women);dyslipidemia;male gender     Objective:    Today's Vitals   There is no height or weight on file to calculate BMI.  Advanced Directives 09/04/2021 07/18/2020 11/25/2012  Does Patient Have a Medical Advance Directive? Yes Yes Patient has advance directive, copy not in chart  Type of Advance Directive [place];Living will - [place];Living will  Does patient want to make changes to medical advance directive? - No - Patient declined -  Copy of [place] in Chart? No - copy requested - -    Current Medications (verified) Outpatient Encounter Medications as of 09/04/2021  Medication Sig   amLODipine (NORVASC) 5 MG tablet Take 1 tablet (5 mg total) by mouth daily.   aspirin 81 MG chewable tablet Chew 81 mg by mouth daily.   fluticasone (FLONASE) 50 MCG/ACT nasal spray Place 1-2  sprays into both nostrils daily as needed for rhinitis.   irbesartan (AVAPRO) 300 MG tablet Take 1 tablet (300 mg total) by mouth daily.   loratadine (CLARITIN) 10 MG tablet Take 10 mg by mouth daily.   montelukast (SINGULAIR) 10 MG tablet Take 1 tablet (10 mg total) by mouth daily.   rosuvastatin (CRESTOR) 5 MG tablet Take 1 tablet (5 mg total) by mouth daily.   valACYclovir (VALTREX) 500 MG tablet Take 1 tablet (500 mg total) by mouth 2 (two) times daily.   zolpidem (AMBIEN) 10 MG tablet 1 po qhs prn   No facility-administered encounter medications on file as of 09/04/2021.    Allergies (verified) Lipitor [atorvastatin]   History: Past Medical History:  Diagnosis Date   Allergy    Diverticulosis    colon 2009 Dr Domenic Moras   Genital herpes    on the leg   H/O hiatal hernia    HTN (hypertension)    Hyperlipidemia    Rosacea    Tinnitus    Past Surgical History:  Procedure Laterality Date   COLONOSCOPY  04/18/2010   at [facility] GI=normal exam    OPEN REDUCTION INTERNAL FIXATION (ORIF) DISTAL RADIAL FRACTURE Left 12/01/2012   Procedure: OPEN REDUCTION INTERNAL FIXATION (ORIF) LEFT DISTAL RADIAL FRACTURE;  Surgeon: Jolyn Nap, MD;  Location: [facility];  Service: Orthopedics;  Laterality: Left;   TONSILLECTOMY     Family History  Problem Relation Age of Onset   Diverticulitis Father    Diabetes Father    Diabetes Mother    Diverticulitis Mother  Colon cancer Neg Hx    Colon polyps Neg Hx    Esophageal cancer Neg Hx    Rectal cancer Neg Hx    Stomach cancer Neg Hx    Social History   Socioeconomic History   Marital status: Married    Spouse name: Not on file   Number of children: Not on file   Years of education: Not on file   Highest education level: Not on file  Occupational History   Occupation: Mudlogger of [employer]   Tobacco Use   Smoking status: Former    Types: Cigarettes    Quit date: 08/20/1986    Years since quitting: 35.0    Smokeless tobacco: Never  Vaping Use   Vaping Use: Never used  Substance and Sexual Activity   Alcohol use: Yes    Alcohol/week: 14.0 standard drinks    Types: 14 Standard drinks or equivalent per week   Drug use: No   Sexual activity: Yes  Other Topics Concern   Not on file  Social History Narrative   Regular exercise - NO         Social Determinants of Health   Financial Resource Strain: Low Risk    Difficulty of Paying Living Expenses: Not hard at all  Food Insecurity: No Food Insecurity   Worried About Charity fundraiser in the Last Year: Never true   Ran Out of Food in the Last Year: Never true  Transportation Needs: No Transportation Needs   Lack of Transportation (Medical): No   Lack of Transportation (Non-Medical): No  Physical Activity: Sufficiently Active   Days of Exercise per Week: 7 days   Minutes of Exercise per Session: 50 min  Stress: No Stress Concern Present   Feeling of Stress : Not at all  Social Connections: Moderately Isolated   Frequency of Communication with Friends and Family: Twice a week   Frequency of Social Gatherings with Friends and Family: Twice a week   Attends Religious Services: Never   Printmaker: No   Attends Music therapist: Never   Marital Status: Married    Tobacco Counseling Counseling given: Not Answered   Clinical Intake:  Pre-visit preparation completed: Yes  Pain : No/denies pain     Nutritional Risks: None Diabetes: No  How often do you need to have someone help you when you read instructions, pamphlets, or other written materials from your doctor or pharmacy?: 1 - Never What is the last grade level you completed in school?: BS  Diabetic?no   Interpreter Needed?: No  Information entered by :: [place] of Daily Living In your present state of health, do you have any difficulty performing the following activities: 09/04/2021  Hearing? N  Vision? N   Difficulty concentrating or making decisions? N  Walking or climbing stairs? N  Dressing or bathing? N  Doing errands, shopping? N  Preparing Food and eating ? N  Using the Toilet? N  In the past six months, have you accidently leaked urine? N  Do you have problems with loss of bowel control? N  Managing your Medications? N  Managing your Finances? N  Housekeeping or managing your Housekeeping? N  Some recent data might be hidden    Patient Care Team: Plotnikov, Evie Lacks, MD as PCP - General  Indicate any recent Medical Services you may have received from other than [facility] providers in the past year (date may be approximate).  Assessment:   This is a routine wellness examination for [facility].  Hearing/Vision screen Vision Screening - Comments:: Annual eye exams   Dietary issues and exercise activities discussed: Current Exercise Habits: Home exercise routine, Type of exercise: walking;strength training/weights, Time (Minutes): 50, Frequency (Times/Week): 7, Weekly Exercise (Minutes/Week): 350, Intensity: Mild, Exercise limited by: None identified   Goals Addressed               This Visit's Progress     Patient Stated (pt-stated)   On track     To maintain my current weight.       Depression Screen PHQ 2/9 Scores 09/04/2021 09/04/2021 07/20/2021 07/18/2020 03/24/2020 12/19/2017 12/17/2016  PHQ - 2 Score 0 0 0 0 0 0 0  PHQ- 9 Score - - 2 - - - -    Fall Risk Fall Risk  09/04/2021 07/20/2021 07/18/2020 03/24/2020 12/19/2017  Falls in the past year? 0 0 0 0 No  Number falls in past yr: 0 0 0 0 -  Injury with Fall? 0 0 0 0 -  Risk for fall due to : - No Fall Risks No Fall Risks - -  Follow up Falls evaluation completed - Falls evaluation completed - -    FALL RISK PREVENTION PERTAINING TO THE HOME:  Any stairs in or around the home? No  If so, are there any without handrails? No  Home free of loose throw rugs in walkways, pet beds, electrical cords, etc? Yes  Adequate  lighting in your home to reduce risk of falls? Yes   ASSISTIVE DEVICES UTILIZED TO PREVENT FALLS:  Life alert? No  Use of a cane, walker or w/c? No  Grab bars in the bathroom? Yes  Shower chair or bench in shower? Yes  Elevated toilet seat or a handicapped toilet? Yes   Cognitive Function:  Normal cognitive status assessed by direct observation by this Nurse Health Advisor. No abnormalities found.     6CIT Screen 07/18/2020  What Year? 0 points  What month? 0 points  What time? 0 points  Count back from 20 0 points  Months in reverse 0 points  Repeat phrase 0 points  Total Score 0    Immunizations Immunization History  Administered Date(s) Administered   Fluad Quad(high Dose 65+) 04/18/2019, 05/06/2020   Influenza Whole 04/18/2010, 06/04/2012   Influenza,inj,Quad PF,6+ Mos 06/08/2013, 06/14/2014, 06/16/2015, 05/25/2016, 05/24/2017, 05/08/2021   Moderna Covid-19 Vaccine Bivalent Booster 63yrs & up 06/13/2021   Moderna SARS-COV2 Booster Vaccination 12/01/2020   PFIZER(Purple Top)SARS-COV-2 Vaccination 09/26/2019, 10/21/2019, 05/14/2020   Pneumococcal Conjugate-13 12/17/2016   Pneumococcal Polysaccharide-23 06/14/2014, 07/20/2019   Td 08/21/2007   Tdap 12/19/2017   Zoster Recombinat (Shingrix) 02/27/2018, 05/15/2018   Zoster, Live 06/18/2012    TDAP status: Up to date  Flu Vaccine status: Up to date  Pneumococcal vaccine status: Up to date  Covid-19 vaccine status: Completed vaccines  Qualifies for Shingles Vaccine? Yes   Zostavax completed Yes   Shingrix Completed?: Yes  Screening Tests Health Maintenance  Topic Date Due   COLONOSCOPY (Pts 45-63yrs Insurance coverage will need to be confirmed)  05/03/2027   TETANUS/TDAP  12/20/2027   Pneumonia Vaccine 67+ Years old  Completed   INFLUENZA VACCINE  Completed   COVID-19 Vaccine  Completed   Hepatitis C Screening  Completed   Zoster Vaccines- Shingrix  Completed   HPV VACCINES  Aged Out    Health  Maintenance  There are no preventive care reminders to display for this patient.  Colorectal cancer screening: Type of screening: Colonoscopy. Completed 05/02/2020. Repeat every 7 years  Lung Cancer Screening: (Low Dose CT Chest recommended if Age 64-80 years, 30 pack-year currently smoking OR have quit w/in 15years.) does not qualify.   Lung Cancer Screening Referral: n/a  Additional Screening:  Hepatitis C Screening: does not qualify; Completed 12/15/2015  Vision Screening: Recommended annual ophthalmology exams for early detection of glaucoma and other disorders of the eye. Is the patient up to date with their annual eye exam?  Yes  Who is the provider or what is the name of the office in which the patient attends annual eye exams? Dr.Lyles  If pt is not established with a provider, would they like to be referred to a provider to establish care? No .   Dental Screening: Recommended annual dental exams for proper oral hygiene  Community Resource Referral / Chronic Care Management: CRR required this visit?  No   CCM required this visit?  No      Plan:     I have personally reviewed and noted the following in the patients chart:   Medical and social history Use of alcohol, tobacco or illicit drugs  Current medications and supplements including opioid prescriptions. Patient is not currently taking opioid prescriptions. Functional ability and status Nutritional status Physical activity Advanced directives List of other physicians Hospitalizations, surgeries, and ER visits in previous 12 months Vitals Screenings to include cognitive, depression, and falls Referrals and appointments  In addition, I have reviewed and discussed with patient certain preventive protocols, quality metrics, and best practice recommendations. A written personalized care plan for preventive services as well as general preventive health recommendations were provided to patient.     Randel Pigg,  LPN   [id number]   Nurse Notes: none   Medical screening examination/treatment/procedure(s) were performed by non-physician practitioner and as supervising physician I was immediately available for consultation/collaboration.  I agree with above. Lew Dawes, MD

## 2021-11-14 ENCOUNTER — Encounter: Payer: Self-pay | Admitting: Internal Medicine

## 2021-11-14 ENCOUNTER — Ambulatory Visit (INDEPENDENT_AMBULATORY_CARE_PROVIDER_SITE_OTHER): Payer: Medicare Other | Admitting: Internal Medicine

## 2021-11-14 VITALS — BP 110/78 | HR 80 | Temp 98.0°F | Ht 68.0 in | Wt 200.0 lb

## 2021-11-14 DIAGNOSIS — J301 Allergic rhinitis due to pollen: Secondary | ICD-10-CM | POA: Diagnosis not present

## 2021-11-14 MED ORDER — METHYLPREDNISOLONE ACETATE 80 MG/ML IJ SUSP
80.0000 mg | Freq: Once | INTRAMUSCULAR | Status: AC
  Administered 2021-11-14: 80 mg via INTRAMUSCULAR

## 2021-11-14 MED ORDER — IPRATROPIUM BROMIDE 0.06 % NA SOLN: 2.0000 | Freq: Three times a day (TID) | NASAL | 2 refills | Status: DC

## 2021-11-14 MED ORDER — CLARITIN-D 24 HOUR 10-240 MG PO TB24: 1.0000 | ORAL_TABLET | Freq: Every day | ORAL | 0 refills | Status: DC | PRN

## 2021-11-14 NOTE — Assessment & Plan Note (Signed)
Worse ?Cont on Claritin or Claritin D and Flonase, Singulair, sinus rinse ?Added Atrovent nasal ?Give Depo-Medrol 80 mg ?

## 2021-11-14 NOTE — Progress Notes (Signed)
? ?Subjective:  ?Patient ID: Raymond Snow, male    DOB: 09/17/1950  Age: 71 y.o. MRN: [id number] ? ?CC: No chief complaint on file. ? ? ?HPI ?Raymond Snow presents for severe allergy, nasal d/c ? ?Outpatient Medications Prior to Visit  ?Medication Sig Dispense Refill  ? amLODipine (NORVASC) 5 MG tablet Take 1 tablet (5 mg total) by mouth daily. 90 tablet 3  ? aspirin 81 MG chewable tablet Chew 81 mg by mouth daily.    ? fluticasone (FLONASE) 50 MCG/ACT nasal spray Place 1-2 sprays into both nostrils daily as needed for rhinitis. 48 g 3  ? irbesartan (AVAPRO) 300 MG tablet Take 1 tablet (300 mg total) by mouth daily. 90 tablet 3  ? loratadine (CLARITIN) 10 MG tablet Take 10 mg by mouth daily.    ? MODERNA COVID-19 BIVAL BOOSTER 50 MCG/0.5ML injection     ? montelukast (SINGULAIR) 10 MG tablet Take 1 tablet (10 mg total) by mouth daily. 90 tablet 3  ? rosuvastatin (CRESTOR) 5 MG tablet Take 1 tablet (5 mg total) by mouth daily. 90 tablet 3  ? valACYclovir (VALTREX) 500 MG tablet Take 1 tablet (500 mg total) by mouth 2 (two) times daily. 180 tablet 1  ? zolpidem (AMBIEN) 10 MG tablet 1 po qhs prn 90 tablet 1  ? ?No facility-administered medications prior to visit.  ? ? ?ROS: ?Review of Systems  ?Constitutional:  Positive for fatigue. Negative for appetite change and unexpected weight change.  ?HENT:  Positive for postnasal drip and rhinorrhea. Negative for congestion, ear pain, nosebleeds, sneezing, sore throat and trouble swallowing.   ?Eyes:  Negative for itching and visual disturbance.  ?Respiratory:  Negative for cough, shortness of breath and wheezing.   ?Cardiovascular:  Negative for chest pain, palpitations and leg swelling.  ?Gastrointestinal:  Negative for abdominal distention, blood in stool, diarrhea and nausea.  ?Genitourinary:  Negative for frequency and hematuria.  ?Musculoskeletal:  Negative for back pain, gait problem, joint swelling and neck pain.  ?Skin:  Negative for rash.  ?Neurological:   Negative for dizziness, tremors, speech difficulty and weakness.  ?Psychiatric/Behavioral:  Negative for agitation, dysphoric mood and sleep disturbance. The patient is not nervous/anxious.   ? ?Objective:  ?BP 110/78 (BP Location: Left Arm, Patient Position: Sitting, Cuff Size: Large)   Pulse 80   Temp 98 ?F (36.7 ?C) (Oral)   Ht '5\' 8"'$  (1.727 m)   Wt 200 lb (90.7 kg)   SpO2 98%   BMI 30.41 kg/m?  ? ?BP Readings from Last 3 Encounters:  ?11/14/21 110/78  ?07/20/21 120/76  ?01/17/21 122/80  ? ? ?Wt Readings from Last 3 Encounters:  ?11/14/21 200 lb (90.7 kg)  ?07/20/21 199 lb 3.2 oz (90.4 kg)  ?01/17/21 191 lb (86.6 kg)  ? ? ?Physical Exam ?Constitutional:   ?   General: He is not in acute distress. ?   Appearance: He is well-developed.  ?   Comments: NAD  ?Eyes:  ?   Conjunctiva/sclera: Conjunctivae normal.  ?   Pupils: Pupils are equal, round, and reactive to light.  ?Neck:  ?   Thyroid: No thyromegaly.  ?   Vascular: No JVD.  ?Cardiovascular:  ?   Rate and Rhythm: Normal rate and regular rhythm.  ?   Heart sounds: Normal heart sounds. No murmur heard. ?  No friction rub. No gallop.  ?Pulmonary:  ?   Effort: Pulmonary effort is normal. No respiratory distress.  ?   Breath sounds: Normal breath  sounds. No wheezing or rales.  ?Chest:  ?   Chest wall: No tenderness.  ?Abdominal:  ?   General: Bowel sounds are normal. There is no distension.  ?   Palpations: Abdomen is soft. There is no mass.  ?   Tenderness: There is no abdominal tenderness. There is no guarding or rebound.  ?Musculoskeletal:     ?   General: No tenderness. Normal range of motion.  ?   Cervical back: Normal range of motion.  ?Lymphadenopathy:  ?   Cervical: No cervical adenopathy.  ?Skin: ?   General: Skin is warm and dry.  ?   Findings: No rash.  ?Neurological:  ?   Mental Status: He is alert and oriented to person, place, and time.  ?   Cranial Nerves: No cranial nerve deficit.  ?   Motor: No abnormal muscle tone.  ?   Coordination:  Coordination normal.  ?   Gait: Gait normal.  ?   Deep Tendon Reflexes: Reflexes are normal and symmetric.  ?Psychiatric:     ?   Behavior: Behavior normal.     ?   Thought Content: Thought content normal.     ?   Judgment: Judgment normal.  ? ? ?Lab Results  ?Component Value Date  ? WBC 7.5 07/01/2020  ? HGB 14.8 07/01/2020  ? HCT 43.9 07/01/2020  ? PLT 189.0 07/01/2020  ? GLUCOSE 89 07/20/2021  ? CHOL 181 01/17/2021  ? TRIG 67.0 01/17/2021  ? HDL 77.90 01/17/2021  ? LDLDIRECT 169.7 06/01/2013  ? [place] 90 01/17/2021  ? ALT 15 07/20/2021  ? AST 21 07/20/2021  ? NA 138 07/20/2021  ? K 4.4 07/20/2021  ? CL 103 07/20/2021  ? CREATININE 1.00 07/20/2021  ? BUN 12 07/20/2021  ? CO2 30 07/20/2021  ? TSH 0.99 07/01/2020  ? PSA 0.70 07/01/2020  ? INR 0.97 11/22/2012  ? HGBA1C 5.4 07/20/2021  ? ? ?US Carotid Bilateral ? ?Result Date: 02/01/2021 ?CLINICAL DATA:  Bruit. Hypertension, hyperlipidemia, previous tobacco abuse. EXAM: BILATERAL CAROTID DUPLEX ULTRASOUND TECHNIQUE: Pearline Cables scale imaging, color Doppler and duplex ultrasound were performed of bilateral carotid and vertebral arteries in the neck. COMPARISON:  None. FINDINGS: Criteria: Quantification of carotid stenosis is based on velocity parameters that correlate the residual internal carotid diameter with NASCET-based stenosis levels, using the diameter of the distal internal carotid lumen as the denominator for stenosis measurement. The following velocity measurements were obtained: RIGHT ICA: 93/17 cm/sec CCA: 008/67 cm/sec SYSTOLIC ICA/CCA RATIO:  0.8 ECA: 149/23 cm/sec LEFT ICA: 106/22 cm/sec CCA: 619/50 cm/sec SYSTOLIC ICA/CCA RATIO:  0.9 ECA: 119/19 cm/sec RIGHT CAROTID ARTERY: Smooth plaque in the carotid bulb and ICA origin with only mild stenosis. Normal waveforms and color Doppler signal throughout. Moderate ICA tortuosity. RIGHT VERTEBRAL ARTERY:  Normal flow direction and waveform. LEFT CAROTID ARTERY: Calcified plaque in the mid common carotid without  stenosis. Eccentric partially calcified plaque in the bulb and ICA origin with only mild stenosis. Moderate tortuosity of the distal ICA. Normal waveforms and color Doppler signal throughout. LEFT VERTEBRAL ARTERY:  Normal flow direction and waveform. Upper extremity blood pressures: RIGHT: 151/81 LEFT: 139/78 IMPRESSION: 1. Bilateral carotid bifurcation plaque resulting in less than 50% diameter ICA stenosis. 2. Antegrade bilateral vertebral arterial flow. Electronically Signed   By: Lucrezia Europe M.D.   On: 02/01/2021 16:30  ? ? ?Assessment & Plan:  ? ?Problem List Items Addressed This Visit   ? ? Allergic rhinitis  ?  Worse ?Cont  on Claritin or Claritin D and Flonase, Singulair, sinus rinse ?Added Atrovent nasal ?Give Depo-Medrol 80 mg ?  ?  ?  ? ? ?Meds ordered this encounter  ?Medications  ? ipratropium (ATROVENT) 0.06 % nasal spray  ?  Sig: Place 2 sprays into the nose 3 (three) times daily.  ?  Dispense:  15 mL  ?  Refill:  2  ?  ? ? ?Follow-up: No follow-ups on file. ? ?Walker Kehr, MD ?

## 2021-11-14 NOTE — Addendum Note (Signed)
Addended by: Marijean Heath R on: 11/14/2021 11:42 AM ? ? Modules accepted: Orders ? ?

## 2021-11-16 ENCOUNTER — Ambulatory Visit: Payer: Medicare Other | Admitting: Internal Medicine

## 2021-11-20 ENCOUNTER — Ambulatory Visit: Payer: Medicare Other | Admitting: Internal Medicine

## 2021-11-23 ENCOUNTER — Ambulatory Visit: Payer: Medicare Other | Admitting: Internal Medicine

## 2022-01-09 ENCOUNTER — Other Ambulatory Visit: Payer: Self-pay | Admitting: Internal Medicine

## 2022-01-18 ENCOUNTER — Ambulatory Visit (INDEPENDENT_AMBULATORY_CARE_PROVIDER_SITE_OTHER): Payer: Medicare Other | Admitting: Internal Medicine

## 2022-01-18 ENCOUNTER — Encounter: Payer: Self-pay | Admitting: Internal Medicine

## 2022-01-18 VITALS — BP 118/80 | HR 87 | Temp 98.7°F | Ht 68.0 in | Wt 199.0 lb

## 2022-01-18 DIAGNOSIS — J301 Allergic rhinitis due to pollen: Secondary | ICD-10-CM

## 2022-01-18 DIAGNOSIS — Z125 Encounter for screening for malignant neoplasm of prostate: Secondary | ICD-10-CM

## 2022-01-18 DIAGNOSIS — F5101 Primary insomnia: Secondary | ICD-10-CM | POA: Diagnosis not present

## 2022-01-18 DIAGNOSIS — Z0000 Encounter for general adult medical examination without abnormal findings: Secondary | ICD-10-CM | POA: Diagnosis not present

## 2022-01-18 DIAGNOSIS — I10 Essential (primary) hypertension: Secondary | ICD-10-CM

## 2022-01-18 LAB — COMPREHENSIVE METABOLIC PANEL
ALT: 12 U/L (ref 0–53)
AST: 16 U/L (ref 0–37)
Albumin: 4.3 g/dL (ref 3.5–5.2)
Alkaline Phosphatase: 86 U/L (ref 39–117)
BUN: 15 mg/dL (ref 6–23)
CO2: 26 mEq/L (ref 19–32)
Calcium: 9.7 mg/dL (ref 8.4–10.5)
Chloride: 100 mEq/L (ref 96–112)
Creatinine, Ser: 1.02 mg/dL (ref 0.40–1.50)
GFR: 74.42 mL/min (ref >60.00)
Glucose, Bld: 79 mg/dL (ref 70–99)
Potassium: 4.3 mEq/L (ref 3.5–5.1)
Sodium: 137 mEq/L (ref 135–145)
Total Bilirubin: 0.8 mg/dL (ref 0.2–1.2)
Total Protein: 7.1 g/dL (ref 6.0–8.3)

## 2022-01-18 LAB — LIPID PANEL
Cholesterol: 175 mg/dL (ref 0–200)
HDL: 70.2 mg/dL (ref >39.00)
LDL Cholesterol: 85 mg/dL (ref 0–99)
NonHDL: 104.78
Total CHOL/HDL Ratio: 2
Triglycerides: 101 mg/dL (ref 0.0–149.0)
VLDL: 20.2 mg/dL (ref 0.0–40.0)

## 2022-01-18 LAB — URINALYSIS
Bilirubin Urine: NEGATIVE
Hgb urine dipstick: NEGATIVE
Ketones, ur: NEGATIVE
Leukocytes,Ua: NEGATIVE
Nitrite: NEGATIVE
Specific Gravity, Urine: 1.015 (ref 1.000–1.030)
Total Protein, Urine: NEGATIVE
Urine Glucose: NEGATIVE
Urobilinogen, UA: 0.2 (ref 0.0–1.0)
pH: 6 (ref 5.0–8.0)

## 2022-01-18 LAB — CBC WITH DIFFERENTIAL/PLATELET
Basophils Absolute: 0.1 10*3/uL (ref 0.0–0.1)
Basophils Relative: 1.3 % (ref 0.0–3.0)
Eosinophils Absolute: 0.2 10*3/uL (ref 0.0–0.7)
Eosinophils Relative: 2.5 % (ref 0.0–5.0)
HCT: 46.2 % (ref 39.0–52.0)
Hemoglobin: 15.3 g/dL (ref 13.0–17.0)
Lymphocytes Relative: 25 % (ref 12.0–46.0)
Lymphs Abs: 2.1 10*3/uL (ref 0.7–4.0)
MCHC: 33.2 g/dL (ref 30.0–36.0)
MCV: 96.1 fl (ref 78.0–100.0)
Monocytes Absolute: 0.7 10*3/uL (ref 0.1–1.0)
Monocytes Relative: 8.2 % (ref 3.0–12.0)
Neutro Abs: 5.2 10*3/uL (ref 1.4–7.7)
Neutrophils Relative %: 63 % (ref 43.0–77.0)
Platelets: 223 10*3/uL (ref 150.0–400.0)
RBC: 4.81 Mil/uL (ref 4.22–5.81)
RDW: 14.6 % (ref 11.5–15.5)
WBC: 8.2 10*3/uL (ref 4.0–10.5)

## 2022-01-18 LAB — PSA: PSA: 1.1 ng/mL (ref 0.10–4.00)

## 2022-01-18 LAB — TSH: TSH: 2.11 u[IU]/mL (ref 0.35–5.50)

## 2022-01-18 MED ORDER — ZOLPIDEM TARTRATE 10 MG PO TABS: ORAL_TABLET | ORAL | 1 refills | Status: DC

## 2022-01-18 MED ORDER — METHYLPREDNISOLONE ACETATE 80 MG/ML IJ SUSP
80.0000 mg | Freq: Once | INTRAMUSCULAR | Status: AC
  Administered 2022-01-18: 80 mg via INTRAMUSCULAR

## 2022-01-18 MED ORDER — VALACYCLOVIR HCL 500 MG PO TABS: 500.0000 mg | ORAL_TABLET | Freq: Two times a day (BID) | ORAL | 3 refills | Status: DC

## 2022-01-18 MED ORDER — NEOMYCIN-POLYMYXIN-HC 3.5-10000-1 OT SOLN: 3.0000 [drp] | Freq: Three times a day (TID) | OTIC | 3 refills | Status: DC

## 2022-01-18 NOTE — Progress Notes (Unsigned)
Subjective:  Patient ID: Raymond Snow, male    DOB: [birth year]  Age: 71 y.o. MRN: [id number]  CC: No chief complaint on file.   HPI DEMETRI GOSHERT presents for a well exam C/o L earache  Outpatient Medications Prior to Visit  Medication Sig Dispense Refill   amLODipine (NORVASC) 5 MG tablet Take 1 tablet (5 mg total) by mouth daily. 90 tablet 3   aspirin 81 MG chewable tablet Chew 81 mg by mouth daily.     chlorhexidine (PERIDEX) 0.12 % solution 2 (two) times daily.     fluticasone (FLONASE) 50 MCG/ACT nasal spray Place 1-2 sprays into both nostrils daily as needed for rhinitis. 48 g 3   ipratropium (ATROVENT) 0.06 % nasal spray Place 2 sprays into the nose 3 (three) times daily. 15 mL 2   irbesartan (AVAPRO) 300 MG tablet Take 1 tablet (300 mg total) by mouth daily. 90 tablet 3   loratadine (CLARITIN) 10 MG tablet Take 10 mg by mouth daily.     loratadine-pseudoephedrine (CLARITIN-D 24 HOUR) 10-240 MG 24 hr tablet Take 1 tablet by mouth daily as needed for allergies. 90 tablet 0   MODERNA COVID-19 BIVAL BOOSTER 50 MCG/0.5ML injection      montelukast (SINGULAIR) 10 MG tablet Take 1 tablet (10 mg total) by mouth daily. 90 tablet 3   rosuvastatin (CRESTOR) 5 MG tablet Take 1 tablet (5 mg total) by mouth daily. 90 tablet 3   valACYclovir (VALTREX) 500 MG tablet Take 1 tablet (500 mg total) by mouth 2 (two) times daily. 180 tablet 1   zolpidem (AMBIEN) 10 MG tablet 1 po qhs prn 90 tablet 1   No facility-administered medications prior to visit.    ROS: Review of Systems  Constitutional:  Negative for appetite change, fatigue and unexpected weight change.  HENT:  Positive for ear pain, postnasal drip, rhinorrhea and sinus pressure. Negative for congestion, nosebleeds, sneezing, sore throat and trouble swallowing.   Eyes:  Negative for itching and visual disturbance.  Respiratory:  Negative for cough.   Cardiovascular:  Negative for chest pain, palpitations and leg swelling.   Gastrointestinal:  Negative for abdominal distention, blood in stool, diarrhea and nausea.  Genitourinary:  Negative for frequency and hematuria.  Musculoskeletal:  Negative for back pain, gait problem, joint swelling and neck pain.  Skin:  Negative for rash.  Neurological:  Negative for dizziness, tremors, speech difficulty and weakness.  Psychiatric/Behavioral:  Negative for agitation, dysphoric mood and sleep disturbance. The patient is not nervous/anxious.    Objective:  BP 118/80 (BP Location: Left Arm, Patient Position: Sitting, Cuff Size: Large)   Pulse 87   Temp 98.7 F (37.1 C) (Oral)   Ht '5\' 8"'$  (1.727 m)   Wt 199 lb (90.3 kg)   SpO2 99%   BMI 30.26 kg/m   BP Readings from Last 3 Encounters:  01/18/22 118/80  11/14/21 110/78  07/20/21 120/76    Wt Readings from Last 3 Encounters:  01/18/22 199 lb (90.3 kg)  11/14/21 200 lb (90.7 kg)  07/20/21 199 lb 3.2 oz (90.4 kg)    Physical Exam Constitutional:      General: He is not in acute distress.    Appearance: He is well-developed.     Comments: NAD  Eyes:     Conjunctiva/sclera: Conjunctivae normal.     Pupils: Pupils are equal, round, and reactive to light.  Neck:     Thyroid: No thyromegaly.     Vascular: No JVD.  Cardiovascular:     Rate and Rhythm: Normal rate and regular rhythm.     Heart sounds: Normal heart sounds. No murmur heard.   No friction rub. No gallop.  Pulmonary:     Effort: Pulmonary effort is normal. No respiratory distress.     Breath sounds: Normal breath sounds. No wheezing or rales.  Chest:     Chest wall: No tenderness.  Abdominal:     General: Bowel sounds are normal. There is no distension.     Palpations: Abdomen is soft. There is no mass.     Tenderness: There is no abdominal tenderness. There is no guarding or rebound.  Genitourinary:    Prostate: Normal.     Rectum: Normal. Guaiac result negative.  Musculoskeletal:        General: No tenderness. Normal range of motion.      Cervical back: Normal range of motion.     Right lower leg: No edema.     Left lower leg: No edema.  Lymphadenopathy:     Cervical: No cervical adenopathy.  Skin:    General: Skin is warm and dry.     Findings: No rash.  Neurological:     Mental Status: He is alert and oriented to person, place, and time.     Cranial Nerves: No cranial nerve deficit.     Motor: No weakness or abnormal muscle tone.     Coordination: Coordination normal.     Gait: Gait normal.     Deep Tendon Reflexes: Reflexes are normal and symmetric.  Psychiatric:        Behavior: Behavior normal.        Thought Content: Thought content normal.        Judgment: Judgment normal.  Wax L ear, partial occlusion  Lab Results  Component Value Date   WBC 7.5 07/01/2020   HGB 14.8 07/01/2020   HCT 43.9 07/01/2020   PLT 189.0 07/01/2020   GLUCOSE 89 07/20/2021   CHOL 181 01/17/2021   TRIG 67.0 01/17/2021   HDL 77.90 01/17/2021   LDLDIRECT 169.7 06/01/2013   LDLCALC 90 01/17/2021   ALT 15 07/20/2021   AST 21 07/20/2021   NA 138 07/20/2021   K 4.4 07/20/2021   CL 103 07/20/2021   CREATININE 1.00 07/20/2021   BUN 12 07/20/2021   CO2 30 07/20/2021   TSH 0.99 07/01/2020   PSA 0.70 07/01/2020   INR 0.97 11/22/2012   HGBA1C 5.4 07/20/2021    US Carotid Bilateral  Result Date: 02/01/2021 CLINICAL DATA:  Bruit. Hypertension, hyperlipidemia, previous tobacco abuse. EXAM: BILATERAL CAROTID DUPLEX ULTRASOUND TECHNIQUE: Pearline Cables scale imaging, color Doppler and duplex ultrasound were performed of bilateral carotid and vertebral arteries in the neck. COMPARISON:  None. FINDINGS: Criteria: Quantification of carotid stenosis is based on velocity parameters that correlate the residual internal carotid diameter with NASCET-based stenosis levels, using the diameter of the distal internal carotid lumen as the denominator for stenosis measurement. The following velocity measurements were obtained: RIGHT ICA: 93/17 cm/sec CCA: 782/42  cm/sec SYSTOLIC ICA/CCA RATIO:  0.8 ECA: 149/23 cm/sec LEFT ICA: 106/22 cm/sec CCA: 353/61 cm/sec SYSTOLIC ICA/CCA RATIO:  0.9 ECA: 119/19 cm/sec RIGHT CAROTID ARTERY: Smooth plaque in the carotid bulb and ICA origin with only mild stenosis. Normal waveforms and color Doppler signal throughout. Moderate ICA tortuosity. RIGHT VERTEBRAL ARTERY:  Normal flow direction and waveform. LEFT CAROTID ARTERY: Calcified plaque in the mid common carotid without stenosis. Eccentric partially calcified plaque in the bulb and ICA  origin with only mild stenosis. Moderate tortuosity of the distal ICA. Normal waveforms and color Doppler signal throughout. LEFT VERTEBRAL ARTERY:  Normal flow direction and waveform. Upper extremity blood pressures: RIGHT: 151/81 LEFT: 139/78 IMPRESSION: 1. Bilateral carotid bifurcation plaque resulting in less than 50% diameter ICA stenosis. 2. Antegrade bilateral vertebral arterial flow. Electronically Signed   By: Lucrezia Europe M.D.   On: 02/01/2021 16:30    Assessment & Plan:   Problem List Items Addressed This Visit     Allergic rhinitis    Worse Given Depo-medrol 80 mg IM        Relevant Medications   methylPREDNISolone acetate (DEPO-MEDROL) injection 80 mg (Start on 01/18/2022  9:15 AM)   Essential hypertension    Cont on Norvasc, Valsartan      Insomnia    On Zolpidem  Potential benefits of a long term benzodiazepines  use as well as potential risks  and complications were explained to the patient and were aknowledged.      Well adult exam - Primary    We discussed age appropriate health related issues, including available/recomended screening tests and vaccinations. We discussed a need for adhering to healthy diet and exercise. Labs were ordered to be later reviewed . All questions were answered.  Colon 2021 Dr Henrene Pastor, due in 7-10 years      Relevant Orders   TSH   Urinalysis   CBC with Differential/Platelet   Lipid panel   PSA   Comprehensive metabolic panel       Meds ordered this encounter  Medications   zolpidem (AMBIEN) 10 MG tablet    Sig: 1 po qhs prn    Dispense:  90 tablet    Refill:  1   valACYclovir (VALTREX) 500 MG tablet    Sig: Take 1 tablet (500 mg total) by mouth 2 (two) times daily.    Dispense:  180 tablet    Refill:  3    This prescription was filled on 12/05/2020. Any refills authorized will be placed on file.   methylPREDNISolone acetate (DEPO-MEDROL) injection 80 mg   neomycin-polymyxin-hydrocortisone (CORTISPORIN) OTIC solution    Sig: Place 3 drops into both ears 3 (three) times daily.    Dispense:  10 mL    Refill:  3      Follow-up: Return in about 6 months (around 07/20/2022) for a follow-up visit.  Walker Kehr, MD

## 2022-01-18 NOTE — Patient Instructions (Signed)
Wax removing kit

## 2022-01-18 NOTE — Assessment & Plan Note (Signed)
On Zolpidem  Potential benefits of a long term benzodiazepines  use as well as potential risks  and complications were explained to the patient and were aknowledged. 

## 2022-01-18 NOTE — Assessment & Plan Note (Addendum)
We discussed age appropriate health related issues, including available/recomended screening tests and vaccinations. We discussed a need for adhering to healthy diet and exercise. Labs were ordered to be later reviewed . All questions were answered.  Colon 2021 Dr Henrene Pastor, due in 7-10 years

## 2022-01-18 NOTE — Assessment & Plan Note (Signed)
Worse Given Depo-medrol 80 mg IM

## 2022-03-30 ENCOUNTER — Other Ambulatory Visit: Payer: Self-pay | Admitting: Internal Medicine

## 2022-04-07 ENCOUNTER — Other Ambulatory Visit: Payer: Self-pay | Admitting: Internal Medicine

## 2022-05-14 ENCOUNTER — Encounter: Payer: Self-pay | Admitting: Internal Medicine

## 2022-05-16 ENCOUNTER — Ambulatory Visit (INDEPENDENT_AMBULATORY_CARE_PROVIDER_SITE_OTHER): Payer: Medicare Other

## 2022-05-16 DIAGNOSIS — Z23 Encounter for immunization: Secondary | ICD-10-CM

## 2022-05-16 NOTE — Progress Notes (Addendum)
After obtaining consent, and per orders of Dr. Alain Marion, injection of High Flu shot given in the left deltoid by Marrian Salvage. Patient tolerated well and instructed to report any adverse reaction to me immediately.   Medical screening examination/treatment/procedure(s) were performed by non-physician practitioner and as supervising physician I was immediately available for consultation/collaboration.  I agree with above. Lew Dawes, MD

## 2022-05-31 DIAGNOSIS — C44329 Squamous cell carcinoma of skin of other parts of face: Secondary | ICD-10-CM | POA: Diagnosis not present

## 2022-05-31 DIAGNOSIS — L570 Actinic keratosis: Secondary | ICD-10-CM | POA: Diagnosis not present

## 2022-05-31 DIAGNOSIS — L578 Other skin changes due to chronic exposure to nonionizing radiation: Secondary | ICD-10-CM | POA: Diagnosis not present

## 2022-05-31 DIAGNOSIS — D492 Neoplasm of unspecified behavior of bone, soft tissue, and skin: Secondary | ICD-10-CM | POA: Diagnosis not present

## 2022-06-18 DIAGNOSIS — H5212 Myopia, left eye: Secondary | ICD-10-CM | POA: Diagnosis not present

## 2022-06-18 DIAGNOSIS — Z961 Presence of intraocular lens: Secondary | ICD-10-CM | POA: Diagnosis not present

## 2022-07-02 DIAGNOSIS — C44329 Squamous cell carcinoma of skin of other parts of face: Secondary | ICD-10-CM | POA: Diagnosis not present

## 2022-07-21 ENCOUNTER — Other Ambulatory Visit: Payer: Self-pay | Admitting: Internal Medicine

## 2022-07-23 ENCOUNTER — Ambulatory Visit (INDEPENDENT_AMBULATORY_CARE_PROVIDER_SITE_OTHER): Payer: Medicare Other | Admitting: Internal Medicine

## 2022-07-23 ENCOUNTER — Encounter: Payer: Self-pay | Admitting: Internal Medicine

## 2022-07-23 VITALS — BP 124/78 | HR 65 | Temp 98.2°F | Ht 68.0 in | Wt 201.0 lb

## 2022-07-23 DIAGNOSIS — F5101 Primary insomnia: Secondary | ICD-10-CM | POA: Diagnosis not present

## 2022-07-23 DIAGNOSIS — J301 Allergic rhinitis due to pollen: Secondary | ICD-10-CM | POA: Diagnosis not present

## 2022-07-23 DIAGNOSIS — E785 Hyperlipidemia, unspecified: Secondary | ICD-10-CM

## 2022-07-23 DIAGNOSIS — I10 Essential (primary) hypertension: Secondary | ICD-10-CM | POA: Diagnosis not present

## 2022-07-23 LAB — LIPID PANEL
Cholesterol: 152 mg/dL (ref 0–200)
HDL: 65.2 mg/dL (ref >39.00)
LDL Cholesterol: 69 mg/dL (ref 0–99)
NonHDL: 86.55
Total CHOL/HDL Ratio: 2
Triglycerides: 90 mg/dL (ref 0.0–149.0)
VLDL: 18 mg/dL (ref 0.0–40.0)

## 2022-07-23 LAB — COMPREHENSIVE METABOLIC PANEL
ALT: 18 U/L (ref 0–53)
AST: 25 U/L (ref 0–37)
Albumin: 4.2 g/dL (ref 3.5–5.2)
Alkaline Phosphatase: 86 U/L (ref 39–117)
BUN: 12 mg/dL (ref 6–23)
CO2: 27 mEq/L (ref 19–32)
Calcium: 9.5 mg/dL (ref 8.4–10.5)
Chloride: 103 mEq/L (ref 96–112)
Creatinine, Ser: 0.91 mg/dL (ref 0.40–1.50)
GFR: 85.04 mL/min (ref >60.00)
Glucose, Bld: 89 mg/dL (ref 70–99)
Potassium: 3.9 mEq/L (ref 3.5–5.1)
Sodium: 137 mEq/L (ref 135–145)
Total Bilirubin: 0.5 mg/dL (ref 0.2–1.2)
Total Protein: 7.2 g/dL (ref 6.0–8.3)

## 2022-07-23 MED ORDER — ZOLPIDEM TARTRATE 10 MG PO TABS
ORAL_TABLET | ORAL | 1 refills | Status: DC
Start: 2022-07-23 — End: 2023-01-22

## 2022-07-23 MED ORDER — FAMOTIDINE 40 MG PO TABS
40.0000 mg | ORAL_TABLET | Freq: Every day | ORAL | 3 refills | Status: DC
Start: 2022-07-23 — End: 2023-01-22

## 2022-07-23 MED ORDER — LEVOCETIRIZINE DIHYDROCHLORIDE 5 MG PO TABS: 5.0000 mg | ORAL_TABLET | Freq: Every evening | ORAL | 11 refills | Status: DC

## 2022-07-23 NOTE — Assessment & Plan Note (Signed)
Check lipids 

## 2022-07-23 NOTE — Assessment & Plan Note (Signed)
  Cont on Norvasc, Valsartan

## 2022-07-23 NOTE — Assessment & Plan Note (Signed)
On Zolpidem  Potential benefits of a long term benzodiazepines  use as well as potential risks  and complications were explained to the patient and were aknowledged.

## 2022-07-23 NOTE — Assessment & Plan Note (Signed)
Doing ok.

## 2022-07-23 NOTE — Progress Notes (Signed)
Subjective:  Patient ID: Raymond Snow, male    DOB: October 10, 1950  Age: 71 y.o. MRN: [id number]  CC: Follow-up (Post nasal draining)   HPI Raymond Snow presents for HTN, dyslipidemia, insomnia S/p root canal surgery - on abx C/o sinus congestion all the time  Outpatient Medications Prior to Visit  Medication Sig Dispense Refill   amLODipine (NORVASC) 5 MG tablet Take 1 tablet (5 mg total) by mouth daily. 90 tablet 3   aspirin 81 MG chewable tablet Chew 81 mg by mouth daily.     chlorhexidine (PERIDEX) 0.12 % solution 2 (two) times daily.     fluticasone (FLONASE) 50 MCG/ACT nasal spray Place 1-2 sprays into both nostrils daily as needed for rhinitis. 48 g 3   ipratropium (ATROVENT) 0.06 % nasal spray Place 2 sprays into the nose 3 (three) times daily. 15 mL 2   irbesartan (AVAPRO) 300 MG tablet TAKE ONE TABLET BY MOUTH DAILY 90 tablet 2   loratadine-pseudoephedrine (CLARITIN-D 24 HOUR) 10-240 MG 24 hr tablet Take 1 tablet by mouth daily as needed for allergies. 90 tablet 0   MODERNA COVID-19 BIVAL BOOSTER 50 MCG/0.5ML injection      montelukast (SINGULAIR) 10 MG tablet Take 1 tablet (10 mg total) by mouth daily. 90 tablet 3   rosuvastatin (CRESTOR) 5 MG tablet Take 1 tablet (5 mg total) by mouth daily. 90 tablet 3   valACYclovir (VALTREX) 500 MG tablet Take 1 tablet (500 mg total) by mouth 2 (two) times daily. 180 tablet 3   zolpidem (AMBIEN) 10 MG tablet 1 po qhs prn 90 tablet 1   loratadine (CLARITIN) 10 MG tablet Take 10 mg by mouth daily. (Patient not taking: Reported on 07/23/2022)     No facility-administered medications prior to visit.    ROS: Review of Systems  Constitutional:  Negative for appetite change, fatigue and unexpected weight change.  HENT:  Negative for congestion, nosebleeds, sneezing, sore throat and trouble swallowing.   Eyes:  Negative for itching and visual disturbance.  Respiratory:  Negative for cough.   Cardiovascular:  Negative for chest pain,  palpitations and leg swelling.  Gastrointestinal:  Negative for abdominal distention, blood in stool, diarrhea and nausea.  Genitourinary:  Negative for frequency and hematuria.  Musculoskeletal:  Negative for back pain, gait problem, joint swelling and neck pain.  Skin:  Negative for rash.  Neurological:  Negative for dizziness, tremors, speech difficulty and weakness.  Psychiatric/Behavioral:  Negative for agitation, dysphoric mood and sleep disturbance. The patient is not nervous/anxious.     Objective:  BP 124/78 (BP Location: Right Arm, Patient Position: Sitting, Cuff Size: Normal)   Pulse 65   Temp 98.2 F (36.8 C) (Oral)   Ht '5\' 8"'$  (1.727 m)   Wt 201 lb (91.2 kg)   SpO2 98%   BMI 30.56 kg/m   BP Readings from Last 3 Encounters:  07/23/22 124/78  01/18/22 118/80  11/14/21 110/78    Wt Readings from Last 3 Encounters:  07/23/22 201 lb (91.2 kg)  01/18/22 199 lb (90.3 kg)  11/14/21 200 lb (90.7 kg)    Physical Exam Constitutional:      General: He is not in acute distress.    Appearance: He is well-developed. He is obese.     Comments: NAD  Eyes:     Conjunctiva/sclera: Conjunctivae normal.     Pupils: Pupils are equal, round, and reactive to light.  Neck:     Thyroid: No thyromegaly.  Vascular: No JVD.  Cardiovascular:     Rate and Rhythm: Normal rate and regular rhythm.     Heart sounds: Normal heart sounds. No murmur heard.    No friction rub. No gallop.  Pulmonary:     Effort: Pulmonary effort is normal. No respiratory distress.     Breath sounds: Normal breath sounds. No wheezing or rales.  Chest:     Chest wall: No tenderness.  Abdominal:     General: Bowel sounds are normal. There is no distension.     Palpations: Abdomen is soft. There is no mass.     Tenderness: There is no abdominal tenderness. There is no guarding or rebound.  Musculoskeletal:        General: No tenderness. Normal range of motion.     Cervical back: Normal range of motion.   Lymphadenopathy:     Cervical: No cervical adenopathy.  Skin:    General: Skin is warm and dry.     Findings: No rash.  Neurological:     Mental Status: He is alert and oriented to person, place, and time.     Cranial Nerves: No cranial nerve deficit.     Motor: No abnormal muscle tone.     Coordination: Coordination normal.     Gait: Gait normal.     Deep Tendon Reflexes: Reflexes are normal and symmetric.  Psychiatric:        Behavior: Behavior normal.        Thought Content: Thought content normal.        Judgment: Judgment normal.     Lab Results  Component Value Date   WBC 8.2 01/18/2022   HGB 15.3 01/18/2022   HCT 46.2 01/18/2022   PLT 223.0 01/18/2022   GLUCOSE 79 01/18/2022   CHOL 175 01/18/2022   TRIG 101.0 01/18/2022   HDL 70.20 01/18/2022   LDLDIRECT 169.7 06/01/2013   LDLCALC 85 01/18/2022   ALT 12 01/18/2022   AST 16 01/18/2022   NA 137 01/18/2022   K 4.3 01/18/2022   CL 100 01/18/2022   CREATININE 1.02 01/18/2022   BUN 15 01/18/2022   CO2 26 01/18/2022   TSH 2.11 01/18/2022   PSA 1.10 01/18/2022   INR 0.97 11/22/2012   HGBA1C 5.4 07/20/2021    US Carotid Bilateral  Result Date: 02/01/2021 CLINICAL DATA:  Bruit. Hypertension, hyperlipidemia, previous tobacco abuse. EXAM: BILATERAL CAROTID DUPLEX ULTRASOUND TECHNIQUE: Pearline Cables scale imaging, color Doppler and duplex ultrasound were performed of bilateral carotid and vertebral arteries in the neck. COMPARISON:  None. FINDINGS: Criteria: Quantification of carotid stenosis is based on velocity parameters that correlate the residual internal carotid diameter with NASCET-based stenosis levels, using the diameter of the distal internal carotid lumen as the denominator for stenosis measurement. The following velocity measurements were obtained: RIGHT ICA: 93/17 cm/sec CCA: 295/28 cm/sec SYSTOLIC ICA/CCA RATIO:  0.8 ECA: 149/23 cm/sec LEFT ICA: 106/22 cm/sec CCA: 413/24 cm/sec SYSTOLIC ICA/CCA RATIO:  0.9 ECA: 119/19  cm/sec RIGHT CAROTID ARTERY: Smooth plaque in the carotid bulb and ICA origin with only mild stenosis. Normal waveforms and color Doppler signal throughout. Moderate ICA tortuosity. RIGHT VERTEBRAL ARTERY:  Normal flow direction and waveform. LEFT CAROTID ARTERY: Calcified plaque in the mid common carotid without stenosis. Eccentric partially calcified plaque in the bulb and ICA origin with only mild stenosis. Moderate tortuosity of the distal ICA. Normal waveforms and color Doppler signal throughout. LEFT VERTEBRAL ARTERY:  Normal flow direction and waveform. Upper extremity blood pressures: RIGHT: 151/81 LEFT:  139/78 IMPRESSION: 1. Bilateral carotid bifurcation plaque resulting in less than 50% diameter ICA stenosis. 2. Antegrade bilateral vertebral arterial flow. Electronically Signed   By: Lucrezia Europe M.D.   On: 02/01/2021 16:30    Assessment & Plan:   Problem List Items Addressed This Visit     Allergic rhinitis - Primary    Worse Sinus rinse q am Xyzal qd Famotidine 40 mg qhs Atrovent nasal Singulair qd Flonase qd ENT ref      Relevant Orders   Ambulatory referral to ENT   Dyslipidemia    Check lipids      Relevant Orders   Lipid panel   Essential hypertension     Cont on Norvasc, Valsartan      Relevant Orders   Comprehensive metabolic panel   Insomnia    On Zolpidem  Potential benefits of a long term benzodiazepines  use as well as potential risks  and complications were explained to the patient and were aknowledged.         Meds ordered this encounter  Medications   zolpidem (AMBIEN) 10 MG tablet    Sig: 1 po qhs prn    Dispense:  90 tablet    Refill:  1   levocetirizine (XYZAL ALLERGY 24HR) 5 MG tablet    Sig: Take 1 tablet (5 mg total) by mouth every evening.    Dispense:  30 tablet    Refill:  11   famotidine (PEPCID) 40 MG tablet    Sig: Take 1 tablet (40 mg total) by mouth daily.    Dispense:  90 tablet    Refill:  3      Follow-up: Return in  about 6 months (around 01/22/2023) for Wellness Exam.  Walker Kehr, MD

## 2022-07-23 NOTE — Assessment & Plan Note (Signed)
Worse Sinus rinse q am Xyzal qd Famotidine 40 mg qhs Atrovent nasal Singulair qd Flonase qd ENT ref

## 2022-08-07 DIAGNOSIS — L570 Actinic keratosis: Secondary | ICD-10-CM | POA: Diagnosis not present

## 2022-09-05 DIAGNOSIS — D225 Melanocytic nevi of trunk: Secondary | ICD-10-CM | POA: Diagnosis not present

## 2022-09-05 DIAGNOSIS — L821 Other seborrheic keratosis: Secondary | ICD-10-CM | POA: Diagnosis not present

## 2022-09-05 DIAGNOSIS — L570 Actinic keratosis: Secondary | ICD-10-CM | POA: Diagnosis not present

## 2022-09-05 DIAGNOSIS — Z08 Encounter for follow-up examination after completed treatment for malignant neoplasm: Secondary | ICD-10-CM | POA: Diagnosis not present

## 2022-09-05 DIAGNOSIS — Z85828 Personal history of other malignant neoplasm of skin: Secondary | ICD-10-CM | POA: Diagnosis not present

## 2022-09-05 DIAGNOSIS — L814 Other melanin hyperpigmentation: Secondary | ICD-10-CM | POA: Diagnosis not present

## 2022-09-14 ENCOUNTER — Ambulatory Visit (INDEPENDENT_AMBULATORY_CARE_PROVIDER_SITE_OTHER): Payer: Medicare Other

## 2022-09-14 VITALS — Ht 68.0 in | Wt 201.0 lb

## 2022-09-14 DIAGNOSIS — Z0000 Encounter for general adult medical examination without abnormal findings: Secondary | ICD-10-CM | POA: Diagnosis not present

## 2022-09-14 NOTE — Progress Notes (Signed)
Virtual Visit via Telephone Note  I connected with  Raymond Snow on 09/14/22 at  8:45 AM EST by telephone and verified that I am speaking with the correct person using two identifiers.  Location: Patient: Home Provider: [place] Persons participating in the virtual visit: [place]   I discussed the limitations, risks, security and privacy concerns of performing an evaluation and management service by telephone and the availability of in person appointments. The patient expressed understanding and agreed to proceed.  Interactive audio and video telecommunications were attempted between this nurse and patient, however failed, due to patient having technical difficulties OR patient did not have access to video capability.  We continued and completed visit with audio only.  Some vital signs may be absent or patient reported.   Sheral Flow, LPN  Subjective:   Raymond Snow is a 72 y.o. male who presents for Medicare Annual/Subsequent preventive examination.  Review of Systems     Cardiac Risk Factors include: advanced age (>64mn, >>75women);dyslipidemia;male gender;hypertension;obesity (BMI >30kg/m2)     Objective:    Today's Vitals   09/14/22 0847 09/14/22 0848  Weight: 201 lb (91.2 kg)   Height: '5\' 8"'$  (1.727 m)   PainSc: 0-No pain 0-No pain   Body mass index is 30.56 kg/m.     09/14/2022    8:49 AM 09/04/2021    8:18 AM 07/18/2020   11:23 AM 11/25/2012    1:53 PM  Advanced Directives  Does Patient Have a Medical Advance Directive? Yes Yes Yes Patient has advance directive, copy not in chart  Type of Advance Directive [place]  [place] will  Does patient want to make changes to medical advance directive?   No - Patient declined   Copy of [place] Chart? No - copy requested No - copy requested      Current  Medications (verified) Outpatient Encounter Medications as of 09/14/2022  Medication Sig   amLODipine (NORVASC) 5 MG tablet Take 1 tablet (5 mg total) by mouth daily.   aspirin 81 MG chewable tablet Chew 81 mg by mouth daily.   chlorhexidine (PERIDEX) 0.12 % solution 2 (two) times daily.   famotidine (PEPCID) 40 MG tablet Take 1 tablet (40 mg total) by mouth daily.   fluticasone (FLONASE) 50 MCG/ACT nasal spray Place 1-2 sprays into both nostrils daily as needed for rhinitis.   ipratropium (ATROVENT) 0.06 % nasal spray Place 2 sprays into the nose 3 (three) times daily.   irbesartan (AVAPRO) 300 MG tablet TAKE ONE TABLET BY MOUTH DAILY   levocetirizine (XYZAL ALLERGY 24HR) 5 MG tablet Take 1 tablet (5 mg total) by mouth every evening.   loratadine-pseudoephedrine (CLARITIN-D 24 HOUR) 10-240 MG 24 hr tablet Take 1 tablet by mouth daily as needed for allergies.   MODERNA COVID-19 BIVAL BOOSTER 50 MCG/0.5ML injection    montelukast (SINGULAIR) 10 MG tablet Take 1 tablet (10 mg total) by mouth daily.   rosuvastatin (CRESTOR) 5 MG tablet Take 1 tablet (5 mg total) by mouth daily.   valACYclovir (VALTREX) 500 MG tablet Take 1 tablet (500 mg total) by mouth 2 (two) times daily.   zolpidem (AMBIEN) 10 MG tablet 1 po qhs prn   No facility-administered encounter medications on file as of 09/14/2022.    Allergies (verified) Lipitor [atorvastatin]   History: Past Medical History:  Diagnosis Date   Allergy    Diverticulosis  colon 2009 Dr Domenic Moras   Genital herpes    on the leg   H/O hiatal hernia    HTN (hypertension)    Hyperlipidemia    Rosacea    Tinnitus    Past Surgical History:  Procedure Laterality Date   COLONOSCOPY  04/18/2010   at [facility] GI=normal exam    OPEN REDUCTION INTERNAL FIXATION (ORIF) DISTAL RADIAL FRACTURE Left 12/01/2012   Procedure: OPEN REDUCTION INTERNAL FIXATION (ORIF) LEFT DISTAL RADIAL FRACTURE;  Surgeon: Jolyn Nap, MD;  Location: [place];  Service: Orthopedics;  Laterality: Left;   TONSILLECTOMY     Family History  Problem Relation Age of Onset   Diverticulitis Father    Diabetes Father    Diabetes Mother    Diverticulitis Mother    Colon cancer Neg Hx    Colon polyps Neg Hx    Esophageal cancer Neg Hx    Rectal cancer Neg Hx    Stomach cancer Neg Hx    Social History   Socioeconomic History   Marital status: Married    Spouse name: Not on file   Number of children: Not on file   Years of education: Not on file   Highest education level: Not on file  Occupational History   Occupation: Mudlogger of [employer]   Tobacco Use   Smoking status: Former    Types: Cigarettes    Quit date: 08/20/1986    Years since quitting: 36.0   Smokeless tobacco: Never  Vaping Use   Vaping Use: Never used  Substance and Sexual Activity   Alcohol use: Yes    Alcohol/week: 14.0 standard drinks of alcohol    Types: 14 Standard drinks or equivalent per week   Drug use: No   Sexual activity: Yes  Other Topics Concern   Not on file  Social History Narrative   Regular exercise - NO         Social Determinants of Health   Financial Resource Strain: Low Risk  (09/14/2022)   Overall Financial Resource Strain (CARDIA)    Difficulty of Paying Living Expenses: Not hard at all  Food Insecurity: No Food Insecurity (09/14/2022)   Hunger Vital Sign    Worried About Running Out of Food in the Last Year: Never true    Ran Out of Food in the Last Year: Never true  Transportation Needs: No Transportation Needs (09/14/2022)   PRAPARE - Hydrologist (Medical): No    Lack of Transportation (Non-Medical): No  Physical Activity: Sufficiently Active (09/14/2022)   Exercise Vital Sign    Days of Exercise per Week: 6 days    Minutes of Exercise per Session: 90 min  Stress: No Stress Concern Present (09/14/2022)   [place]    Feeling of  Stress : Not at all  Social Connections: Socially Isolated (09/14/2022)   Social Connection and Isolation Panel [NHANES]    Frequency of Communication with Friends and Family: Once a week    Frequency of Social Gatherings with Friends and Family: Once a week    Attends Religious Services: Never    Marine scientist or Organizations: No    Attends Music therapist: Never    Marital Status: Married    Tobacco Counseling Counseling given: Not Answered   Clinical Intake:  Pre-visit preparation completed: Yes  Pain : No/denies pain Pain Score: 0-No pain     BMI -  recorded: 30.56 Nutritional Status: BMI > 30  Obese Nutritional Risks: None Diabetes: No  How often do you need to have someone help you when you read instructions, pamphlets, or other written materials from your doctor or pharmacy?: 1 - Never What is the last grade level you completed in school?: Bachelor's Degree  Diabetic? No  Interpreter Needed?: No  Information entered by :: Lisette Abu, LPN.   Activities of Daily Living    09/14/2022    8:57 AM 09/13/2022    9:59 AM  In your present state of health, do you have any difficulty performing the following activities:  Hearing? 0 0  Vision? 0 0  Difficulty concentrating or making decisions? 0 0  Walking or climbing stairs? 0 0  Dressing or bathing? 0 0  Doing errands, shopping? 0 0  Preparing Food and eating ? N N  Using the Toilet? N N  In the past six months, have you accidently leaked urine? N N  Do you have problems with loss of bowel control? N N  Managing your Medications? N N  Managing your Finances? N N  Housekeeping or managing your Housekeeping? N N    Patient Care Team: Plotnikov, Evie Lacks, MD as PCP - General  Indicate any recent Medical Services you may have received from other than [facility] providers in the past year (date may be approximate).     Assessment:   This is a routine wellness examination for  [facility].  Hearing/Vision screen Hearing Screening - Comments:: Denies hearing difficulties   Vision Screening - Comments:: Wears readers for fine print only - up to date with routine eye exams with Katy Apo, MD.   Dietary issues and exercise activities discussed: Current Exercise Habits: Home exercise routine, Time (Minutes): 60, Frequency (Times/Week): 6, Weekly Exercise (Minutes/Week): 360, Intensity: Moderate, Exercise limited by: None identified   Goals Addressed             This Visit's Progress    My goal for 2024 is to lose a few pounds and stay under 200 pounds.        Depression Screen    09/14/2022    8:51 AM 07/23/2022    9:12 AM 09/04/2021    8:19 AM 09/04/2021    8:16 AM 07/20/2021    1:26 PM 07/18/2020   11:24 AM 03/24/2020    9:55 AM  PHQ 2/9 Scores  PHQ - 2 Score 0 0 0 0 0 0 0  PHQ- 9 Score     2      Fall Risk    09/14/2022    8:50 AM 09/13/2022    9:59 AM 07/23/2022    9:11 AM 09/04/2021    8:19 AM 07/20/2021    1:26 PM  [place] in the past year? 0 0 0 0 0  Number falls in past yr: 0 0 0 0 0  Injury with Fall? 0 0 0 0 0  Risk for fall due to : No Fall Risks  No Fall Risks  No Fall Risks  Follow up Falls prevention discussed  Falls evaluation completed Falls evaluation completed     FALL RISK PREVENTION PERTAINING TO THE HOME:  Any stairs in or around the home? No  If so, are there any without handrails? No  Home free of loose throw rugs in walkways, pet beds, electrical cords, etc? Yes  Adequate lighting in your home to reduce risk of falls? Yes   ASSISTIVE DEVICES UTILIZED  TO PREVENT FALLS:  Life alert? No  Use of a cane, walker or w/c? No  Grab bars in the bathroom? Yes  Shower chair or bench in shower? Yes  Elevated toilet seat or a handicapped toilet? Yes   TIMED UP AND GO: Phone Visit  Was the test performed? No .   Cognitive Function:        09/14/2022    8:59 AM 07/18/2020   11:25 AM  6CIT Screen  What Year? 0  points 0 points  What month? 0 points 0 points  What time? 0 points 0 points  Count back from 20 0 points 0 points  Months in reverse 0 points 0 points  Repeat phrase 0 points 0 points  Total Score 0 points 0 points    Immunizations Immunization History  Administered Date(s) Administered   Fluad Quad(high Dose 65+) 04/18/2019, 05/06/2020, 05/16/2022   Influenza Whole 04/18/2010, 06/04/2012   Influenza,inj,Quad PF,6+ Mos 06/08/2013, 06/14/2014, 06/16/2015, 05/25/2016, 05/24/2017, 05/08/2021   Moderna Covid-19 Vaccine Bivalent Booster 13yr & up 06/13/2021   Moderna SARS-COV2 Booster Vaccination 12/01/2020   PFIZER(Purple Top)SARS-COV-2 Vaccination 09/26/2019, 10/21/2019, 05/14/2020   Pneumococcal Conjugate-13 12/17/2016   Pneumococcal Polysaccharide-23 06/14/2014, 07/20/2019   Td 08/21/2007   Tdap 12/19/2017   Zoster Recombinat (Shingrix) 02/27/2018, 05/15/2018   Zoster, Live 06/18/2012    TDAP status: Up to date  Flu Vaccine status: Up to date  Pneumococcal vaccine status: Up to date  Covid-19 vaccine status: Completed vaccines  Qualifies for Shingles Vaccine? Yes   Zostavax completed Yes   Shingrix Completed?: Yes  Screening Tests Health Maintenance  Topic Date Due   COVID-19 Vaccine (5 - 2023-24 season) 04/20/2022   Medicare Annual Wellness (AWV)  09/15/2023   COLONOSCOPY (Pts 45-48yrInsurance coverage will need to be confirmed)  05/03/2027   DTaP/Tdap/Td (3 - Td or Tdap) 12/20/2027   Pneumonia Vaccine 6564Years old  Completed   INFLUENZA VACCINE  Completed   Hepatitis C Screening  Completed   Zoster Vaccines- Shingrix  Completed   HPV VACCINES  Aged Out    Health Maintenance  Health Maintenance Due  Topic Date Due   COVID-19 Vaccine (5 - 2023-24 season) 04/20/2022    Colorectal cancer screening: Type of screening: Colonoscopy. Completed 05/02/2020. Repeat every 7 years  Lung Cancer Screening: (Low Dose CT Chest recommended if Age 72-80ears, 30  pack-year currently smoking OR have quit w/in 15years.) does not qualify.   Lung Cancer Screening Referral: no  Additional Screening:  Hepatitis C Screening: does qualify; Completed 12/15/2015  Vision Screening: Recommended annual ophthalmology exams for early detection of glaucoma and other disorders of the eye. Is the patient up to date with their annual eye exam?  Yes  Who is the provider or what is the name of the office in which the patient attends annual eye exams? [facility]. If pt is not established with a provider, would they like to be referred to a provider to establish care? No .   Dental Screening: Recommended annual dental exams for proper oral hygiene  Community Resource Referral / Chronic Care Management: CRR required this visit?  No   CCM required this visit?  No      Plan:     I have personally reviewed and noted the following in the patient's chart:   Medical and social history Use of alcohol, tobacco or illicit drugs  Current medications and supplements including opioid prescriptions. Patient is not currently taking opioid prescriptions. Functional ability  and status Nutritional status Physical activity Advanced directives List of other physicians Hospitalizations, surgeries, and ER visits in previous 12 months Vitals Screenings to include cognitive, depression, and falls Referrals and appointments  In addition, I have reviewed and discussed with patient certain preventive protocols, quality metrics, and best practice recommendations. A written personalized care plan for preventive services as well as general preventive health recommendations were provided to patient.     Sheral Flow, LPN   05/18/5746   Nurse Notes: N/A

## 2022-09-14 NOTE — Patient Instructions (Signed)
[name] , Thank you for taking time to come for your Medicare Wellness Visit. I appreciate your ongoing commitment to your health goals. Please review the following plan we discussed and let me know if I can assist you in the future.   These are the goals we discussed:  Goals      My goal for 2024 is to lose a few pounds and stay under 200 pounds.        This is a list of the screening recommended for you and due dates:  Health Maintenance  Topic Date Due   COVID-19 Vaccine (5 - 2023-24 season) 04/20/2022   Medicare Annual Wellness Visit  09/15/2023   Colon Cancer Screening  05/03/2027   DTaP/Tdap/Td vaccine (3 - Td or Tdap) 12/20/2027   Pneumonia Vaccine  Completed   Flu Shot  Completed   Hepatitis C Screening: USPSTF Recommendation to screen - Ages 18-79 yo.  Completed   Zoster (Shingles) Vaccine  Completed   HPV Vaccine  Aged Out    Advanced directives: Yes  Conditions/risks identified: Yes  Next appointment: Follow up in one year for your annual wellness visit.   Preventive Care 65 Years and Older, Male  Preventive care refers to lifestyle choices and visits with your health care provider that can promote health and wellness. What does preventive care include? A yearly physical exam. This is also called an annual well check. Dental exams once or twice a year. Routine eye exams. Ask your health care provider how often you should have your eyes checked. Personal lifestyle choices, including: Daily care of your teeth and gums. Regular physical activity. Eating a healthy diet. Avoiding tobacco and drug use. Limiting alcohol use. Practicing safe sex. Taking low doses of aspirin every day. Taking vitamin and mineral supplements as recommended by your health care provider. What happens during an annual well check? The services and screenings done by your health care provider during your annual well check will depend on your age, overall health, lifestyle risk factors,  and family history of disease. Counseling  Your health care provider may ask you questions about your: Alcohol use. Tobacco use. Drug use. Emotional well-being. Home and relationship well-being. Sexual activity. Eating habits. History of falls. Memory and ability to understand (cognition). Work and work Statistician. Screening  You may have the following tests or measurements: Height, weight, and BMI. Blood pressure. Lipid and cholesterol levels. These may be checked every 5 years, or more frequently if you are over 83 years old. Skin check. Lung cancer screening. You may have this screening every year starting at age 5 if you have a 30-pack-year history of smoking and currently smoke or have quit within the past 15 years. Fecal occult blood test (FOBT) of the stool. You may have this test every year starting at age 43. Flexible sigmoidoscopy or colonoscopy. You may have a sigmoidoscopy every 5 years or a colonoscopy every 10 years starting at age 17. Prostate cancer screening. Recommendations will vary depending on your family history and other risks. Hepatitis C blood test. Hepatitis B blood test. Sexually transmitted disease (STD) testing. Diabetes screening. This is done by checking your blood sugar (glucose) after you have not eaten for a while (fasting). You may have this done every 1-3 years. Abdominal aortic aneurysm (AAA) screening. You may need this if you are a current or former smoker. Osteoporosis. You may be screened starting at age 42 if you are at high risk. Talk with your health care provider  about your test results, treatment options, and if necessary, the need for more tests. Vaccines  Your health care provider may recommend certain vaccines, such as: Influenza vaccine. This is recommended every year. Tetanus, diphtheria, and acellular pertussis (Tdap, Td) vaccine. You may need a Td booster every 10 years. Zoster vaccine. You may need this after age  61. Pneumococcal 13-valent conjugate (PCV13) vaccine. One dose is recommended after age 18. Pneumococcal polysaccharide (PPSV23) vaccine. One dose is recommended after age 22. Talk to your health care provider about which screenings and vaccines you need and how often you need them. This information is not intended to replace advice given to you by your health care provider. Make sure you discuss any questions you have with your health care provider. Document Released: 09/02/2015 Document Revised: 04/25/2016 Document Reviewed: 06/07/2015 Elsevier Interactive Patient Education  2017 [place] Prevention in the Home Falls can cause injuries. They can happen to people of all ages. There are many things you can do to make your home safe and to help prevent falls. What can I do on the outside of my home? Regularly fix the edges of walkways and driveways and fix any cracks. Remove anything that might make you trip as you walk through a door, such as a raised step or threshold. Trim any bushes or trees on the path to your home. Use bright outdoor lighting. Clear any walking paths of anything that might make someone trip, such as rocks or tools. Regularly check to see if handrails are loose or broken. Make sure that both sides of any steps have handrails. Any raised decks and porches should have guardrails on the edges. Have any leaves, snow, or ice cleared regularly. Use sand or salt on walking paths during winter. Clean up any spills in your garage right away. This includes oil or grease spills. What can I do in the bathroom? Use night lights. Install grab bars by the toilet and in the tub and shower. Do not use towel bars as grab bars. Use non-skid mats or decals in the tub or shower. If you need to sit down in the shower, use a plastic, non-slip stool. Keep the floor dry. Clean up any water that spills on the floor as soon as it happens. Remove soap buildup in the tub or shower  regularly. Attach bath mats securely with double-sided non-slip rug tape. Do not have throw rugs and other things on the floor that can make you trip. What can I do in the bedroom? Use night lights. Make sure that you have a light by your bed that is easy to reach. Do not use any sheets or blankets that are too big for your bed. They should not hang down onto the floor. Have a firm chair that has side arms. You can use this for support while you get dressed. Do not have throw rugs and other things on the floor that can make you trip. What can I do in the kitchen? Clean up any spills right away. Avoid walking on wet floors. Keep items that you use a lot in easy-to-reach places. If you need to reach something above you, use a strong step stool that has a grab bar. Keep electrical cords out of the way. Do not use floor polish or wax that makes floors slippery. If you must use wax, use non-skid floor wax. Do not have throw rugs and other things on the floor that can make you trip. What can I do  with my stairs? Do not leave any items on the stairs. Make sure that there are handrails on both sides of the stairs and use them. Fix handrails that are broken or loose. Make sure that handrails are as long as the stairways. Check any carpeting to make sure that it is firmly attached to the stairs. Fix any carpet that is loose or worn. Avoid having throw rugs at the top or bottom of the stairs. If you do have throw rugs, attach them to the floor with carpet tape. Make sure that you have a light switch at the top of the stairs and the bottom of the stairs. If you do not have them, ask someone to add them for you. What else can I do to help prevent falls? Wear shoes that: Do not have high heels. Have rubber bottoms. Are comfortable and fit you well. Are closed at the toe. Do not wear sandals. If you use a stepladder: Make sure that it is fully opened. Do not climb a closed stepladder. Make sure that  both sides of the stepladder are locked into place. Ask someone to hold it for you, if possible. Clearly mark and make sure that you can see: Any grab bars or handrails. First and last steps. Where the edge of each step is. Use tools that help you move around (mobility aids) if they are needed. These include: Canes. Walkers. Scooters. Crutches. Turn on the lights when you go into a dark area. Replace any light bulbs as soon as they burn out. Set up your furniture so you have a clear path. Avoid moving your furniture around. If any of your floors are uneven, fix them. If there are any pets around you, be aware of where they are. Review your medicines with your doctor. Some medicines can make you feel dizzy. This can increase your chance of falling. Ask your doctor what other things that you can do to help prevent falls. This information is not intended to replace advice given to you by your health care provider. Make sure you discuss any questions you have with your health care provider. Document Released: 06/02/2009 Document Revised: 01/12/2016 Document Reviewed: 09/10/2014 Elsevier Interactive Patient Education  2017 [employer].

## 2022-10-09 DIAGNOSIS — K219 Gastro-esophageal reflux disease without esophagitis: Secondary | ICD-10-CM | POA: Insufficient documentation

## 2022-10-09 DIAGNOSIS — J309 Allergic rhinitis, unspecified: Secondary | ICD-10-CM | POA: Diagnosis not present

## 2022-12-20 ENCOUNTER — Other Ambulatory Visit: Payer: Self-pay | Admitting: Internal Medicine

## 2022-12-22 ENCOUNTER — Other Ambulatory Visit: Payer: Self-pay | Admitting: Internal Medicine

## 2022-12-24 MED ORDER — IRBESARTAN 300 MG PO TABS: 300.0000 mg | ORAL_TABLET | Freq: Every day | ORAL | 0 refills | Status: DC

## 2023-01-08 ENCOUNTER — Other Ambulatory Visit: Payer: Self-pay | Admitting: Internal Medicine

## 2023-01-22 ENCOUNTER — Ambulatory Visit (INDEPENDENT_AMBULATORY_CARE_PROVIDER_SITE_OTHER): Payer: Medicare Other | Admitting: Internal Medicine

## 2023-01-22 ENCOUNTER — Encounter: Payer: Self-pay | Admitting: Internal Medicine

## 2023-01-22 VITALS — BP 118/78 | HR 60 | Temp 98.0°F | Ht 68.0 in | Wt 189.0 lb

## 2023-01-22 DIAGNOSIS — N320 Bladder-neck obstruction: Secondary | ICD-10-CM | POA: Diagnosis not present

## 2023-01-22 DIAGNOSIS — Z0000 Encounter for general adult medical examination without abnormal findings: Secondary | ICD-10-CM

## 2023-01-22 DIAGNOSIS — G8929 Other chronic pain: Secondary | ICD-10-CM

## 2023-01-22 DIAGNOSIS — M5440 Lumbago with sciatica, unspecified side: Secondary | ICD-10-CM

## 2023-01-22 DIAGNOSIS — R739 Hyperglycemia, unspecified: Secondary | ICD-10-CM

## 2023-01-22 DIAGNOSIS — E785 Hyperlipidemia, unspecified: Secondary | ICD-10-CM

## 2023-01-22 DIAGNOSIS — I10 Essential (primary) hypertension: Secondary | ICD-10-CM | POA: Diagnosis not present

## 2023-01-22 LAB — URINALYSIS
Bilirubin Urine: NEGATIVE
Hgb urine dipstick: NEGATIVE
Ketones, ur: NEGATIVE
Leukocytes,Ua: NEGATIVE
Nitrite: NEGATIVE
Specific Gravity, Urine: 1.02 (ref 1.000–1.030)
Total Protein, Urine: NEGATIVE
Urine Glucose: NEGATIVE
Urobilinogen, UA: 0.2 (ref 0.0–1.0)
pH: 5.5 (ref 5.0–8.0)

## 2023-01-22 LAB — CBC WITH DIFFERENTIAL/PLATELET
Basophils Absolute: 0.1 10*3/uL (ref 0.0–0.1)
Basophils Relative: 1.2 % (ref 0.0–3.0)
Eosinophils Absolute: 0.1 10*3/uL (ref 0.0–0.7)
Eosinophils Relative: 1.9 % (ref 0.0–5.0)
HCT: 45.7 % (ref 39.0–52.0)
Hemoglobin: 14.8 g/dL (ref 13.0–17.0)
Lymphocytes Relative: 26 % (ref 12.0–46.0)
Lymphs Abs: 1.6 10*3/uL (ref 0.7–4.0)
MCHC: 32.3 g/dL (ref 30.0–36.0)
MCV: 97.7 fl (ref 78.0–100.0)
Monocytes Absolute: 0.6 10*3/uL (ref 0.1–1.0)
Monocytes Relative: 9.2 % (ref 3.0–12.0)
Neutro Abs: 3.8 10*3/uL (ref 1.4–7.7)
Neutrophils Relative %: 61.7 % (ref 43.0–77.0)
Platelets: 202 10*3/uL (ref 150.0–400.0)
RBC: 4.68 Mil/uL (ref 4.22–5.81)
RDW: 15.4 % (ref 11.5–15.5)
WBC: 6.2 10*3/uL (ref 4.0–10.5)

## 2023-01-22 LAB — HEMOGLOBIN A1C: Hgb A1c MFr Bld: 5.3 % (ref 4.6–6.5)

## 2023-01-22 LAB — LIPID PANEL
Cholesterol: 173 mg/dL (ref 0–200)
HDL: 90.7 mg/dL (ref >39.00)
LDL Cholesterol: 72 mg/dL (ref 0–99)
NonHDL: 81.9
Total CHOL/HDL Ratio: 2
Triglycerides: 49 mg/dL (ref 0.0–149.0)
VLDL: 9.8 mg/dL (ref 0.0–40.0)

## 2023-01-22 LAB — COMPREHENSIVE METABOLIC PANEL
ALT: 11 U/L (ref 0–53)
AST: 20 U/L (ref 0–37)
Albumin: 4.4 g/dL (ref 3.5–5.2)
Alkaline Phosphatase: 85 U/L (ref 39–117)
BUN: 13 mg/dL (ref 6–23)
CO2: 28 mEq/L (ref 19–32)
Calcium: 9.6 mg/dL (ref 8.4–10.5)
Chloride: 103 mEq/L (ref 96–112)
Creatinine, Ser: 1 mg/dL (ref 0.40–1.50)
GFR: 75.67 mL/min (ref >60.00)
Glucose, Bld: 84 mg/dL (ref 70–99)
Potassium: 5 mEq/L (ref 3.5–5.1)
Sodium: 139 mEq/L (ref 135–145)
Total Bilirubin: 1 mg/dL (ref 0.2–1.2)
Total Protein: 7.1 g/dL (ref 6.0–8.3)

## 2023-01-22 LAB — TSH: TSH: 0.86 u[IU]/mL (ref 0.35–5.50)

## 2023-01-22 LAB — PSA: PSA: 1.07 ng/mL (ref 0.10–4.00)

## 2023-01-22 MED ORDER — ROSUVASTATIN CALCIUM 5 MG PO TABS: 5.0000 mg | ORAL_TABLET | Freq: Every day | ORAL | 3 refills | Status: DC

## 2023-01-22 MED ORDER — FAMOTIDINE 40 MG PO TABS: 40.0000 mg | ORAL_TABLET | Freq: Every day | ORAL | 3 refills | Status: AC

## 2023-01-22 MED ORDER — IPRATROPIUM BROMIDE 0.06 % NA SOLN: 2.0000 | Freq: Three times a day (TID) | NASAL | 2 refills | Status: AC

## 2023-01-22 MED ORDER — VALACYCLOVIR HCL 500 MG PO TABS: 500.0000 mg | ORAL_TABLET | Freq: Two times a day (BID) | ORAL | 3 refills | Status: DC

## 2023-01-22 MED ORDER — IRBESARTAN 300 MG PO TABS: 300.0000 mg | ORAL_TABLET | Freq: Every day | ORAL | 3 refills | Status: DC

## 2023-01-22 MED ORDER — MONTELUKAST SODIUM 10 MG PO TABS: 10.0000 mg | ORAL_TABLET | Freq: Every day | ORAL | 3 refills | Status: DC

## 2023-01-22 MED ORDER — AMLODIPINE BESYLATE 5 MG PO TABS: 5.0000 mg | ORAL_TABLET | Freq: Every day | ORAL | 3 refills | Status: DC

## 2023-01-22 MED ORDER — ZOLPIDEM TARTRATE 10 MG PO TABS: ORAL_TABLET | ORAL | 1 refills | Status: DC

## 2023-01-22 MED ORDER — FLUTICASONE PROPIONATE 50 MCG/ACT NA SUSP: 1.0000 | Freq: Every day | NASAL | 3 refills | Status: AC | PRN

## 2023-01-22 MED ORDER — LEVOCETIRIZINE DIHYDROCHLORIDE 5 MG PO TABS: 5.0000 mg | ORAL_TABLET | Freq: Every evening | ORAL | 3 refills | Status: AC

## 2023-01-22 NOTE — Assessment & Plan Note (Signed)
Check A1c. 

## 2023-01-22 NOTE — Progress Notes (Signed)
Subjective:  Patient ID: Raymond Snow, male    DOB: [birth year]  Age: 72 y.o. MRN: [id number]  CC: Annual Exam   HPI Raymond Snow presents for a well exam Pt lost wt on diet  Outpatient Medications Prior to Visit  Medication Sig Dispense Refill   aspirin 81 MG chewable tablet Chew 81 mg by mouth daily.     chlorhexidine (PERIDEX) 0.12 % solution 2 (two) times daily.     famotidine (PEPCID) 40 MG tablet Take 1 tablet (40 mg total) by mouth daily. 90 tablet 3   fluticasone (FLONASE) 50 MCG/ACT nasal spray Place 1-2 sprays into both nostrils daily as needed for rhinitis. 48 g 3   irbesartan (AVAPRO) 300 MG tablet Take 1 tablet (300 mg total) by mouth daily. Annual appt due in June must see provider for future refills 30 tablet 0   levocetirizine (XYZAL ALLERGY 24HR) 5 MG tablet Take 1 tablet (5 mg total) by mouth every evening. 30 tablet 11   MODERNA COVID-19 BIVAL BOOSTER 50 MCG/0.5ML injection      montelukast (SINGULAIR) 10 MG tablet Take 1 tablet (10 mg total) by mouth daily. Annual appt due in June must see provider for future refills 90 tablet 0   rosuvastatin (CRESTOR) 5 MG tablet Take 1 tablet (5 mg total) by mouth daily. 90 tablet 3   valACYclovir (VALTREX) 500 MG tablet Take 1 tablet (500 mg total) by mouth 2 (two) times daily. 180 tablet 3   zolpidem (AMBIEN) 10 MG tablet 1 po qhs prn 90 tablet 1   amLODipine (NORVASC) 5 MG tablet Take 1 tablet (5 mg total) by mouth daily. 90 tablet 3   ipratropium (ATROVENT) 0.06 % nasal spray Place 2 sprays into the nose 3 (three) times daily. 15 mL 2   loratadine-pseudoephedrine (CLARITIN-D 24 HOUR) 10-240 MG 24 hr tablet Take 1 tablet by mouth daily as needed for allergies. 90 tablet 0   No facility-administered medications prior to visit.    ROS: Review of Systems  Constitutional:  Negative for appetite change, fatigue and unexpected weight change.  HENT:  Negative for congestion, nosebleeds, sneezing, sore throat and trouble  swallowing.   Eyes:  Negative for itching and visual disturbance.  Respiratory:  Negative for cough.   Cardiovascular:  Negative for chest pain, palpitations and leg swelling.  Gastrointestinal:  Negative for abdominal distention, blood in stool, diarrhea and nausea.  Genitourinary:  Negative for frequency and hematuria.  Musculoskeletal:  Negative for back pain, gait problem, joint swelling and neck pain.  Skin:  Negative for rash.  Neurological:  Negative for dizziness, tremors, speech difficulty and weakness.  Psychiatric/Behavioral:  Negative for agitation, dysphoric mood and sleep disturbance. The patient is not nervous/anxious.     Objective:  BP 118/78   Pulse 60   Temp 98 F (36.7 C) (Oral)   Ht 5\' 8"  (1.727 m)   Wt 189 lb (85.7 kg)   SpO2 98%   BMI 28.74 kg/m   BP Readings from Last 3 Encounters:  01/22/23 118/78  07/23/22 124/78  01/18/22 118/80    Wt Readings from Last 3 Encounters:  01/22/23 189 lb (85.7 kg)  09/14/22 201 lb (91.2 kg)  07/23/22 201 lb (91.2 kg)    Physical Exam Constitutional:      General: He is not in acute distress.    Appearance: He is well-developed. He is obese.     Comments: NAD  Eyes:     Conjunctiva/sclera: Conjunctivae normal.  Pupils: Pupils are equal, round, and reactive to light.  Neck:     Thyroid: No thyromegaly.     Vascular: No JVD.  Cardiovascular:     Rate and Rhythm: Normal rate and regular rhythm.     Heart sounds: Normal heart sounds. No murmur heard.    No friction rub. No gallop.  Pulmonary:     Effort: Pulmonary effort is normal. No respiratory distress.     Breath sounds: Normal breath sounds. No wheezing or rales.  Chest:     Chest wall: No tenderness.  Abdominal:     General: Bowel sounds are normal. There is no distension.     Palpations: Abdomen is soft. There is no mass.     Tenderness: There is no abdominal tenderness. There is no guarding or rebound.  Musculoskeletal:        General: No  tenderness. Normal range of motion.     Cervical back: Normal range of motion.  Lymphadenopathy:     Cervical: No cervical adenopathy.  Skin:    General: Skin is warm and dry.     Findings: No rash.  Neurological:     Mental Status: He is alert and oriented to person, place, and time.     Cranial Nerves: No cranial nerve deficit.     Motor: No abnormal muscle tone.     Coordination: Coordination normal.     Gait: Gait normal.     Deep Tendon Reflexes: Reflexes are normal and symmetric.  Psychiatric:        Behavior: Behavior normal.        Thought Content: Thought content normal.        Judgment: Judgment normal.     Lab Results  Component Value Date   WBC 8.2 01/18/2022   HGB 15.3 01/18/2022   HCT 46.2 01/18/2022   PLT 223.0 01/18/2022   GLUCOSE 89 07/23/2022   CHOL 152 07/23/2022   TRIG 90.0 07/23/2022   HDL 65.20 07/23/2022   LDLDIRECT 169.7 06/01/2013   LDLCALC 69 07/23/2022   ALT 18 07/23/2022   AST 25 07/23/2022   NA 137 07/23/2022   K 3.9 07/23/2022   CL 103 07/23/2022   CREATININE 0.91 07/23/2022   BUN 12 07/23/2022   CO2 27 07/23/2022   TSH 2.11 01/18/2022   PSA 1.10 01/18/2022   INR 0.97 11/22/2012   HGBA1C 5.4 07/20/2021    US Carotid Bilateral  Result Date: 02/01/2021 CLINICAL DATA:  Bruit. Hypertension, hyperlipidemia, previous tobacco abuse. EXAM: BILATERAL CAROTID DUPLEX ULTRASOUND TECHNIQUE: Wallace Cullens scale imaging, color Doppler and duplex ultrasound were performed of bilateral carotid and vertebral arteries in the neck. COMPARISON:  None. FINDINGS: Criteria: Quantification of carotid stenosis is based on velocity parameters that correlate the residual internal carotid diameter with NASCET-based stenosis levels, using the diameter of the distal internal carotid lumen as the denominator for stenosis measurement. The following velocity measurements were obtained: RIGHT ICA: 93/17 cm/sec CCA: 124/19 cm/sec SYSTOLIC ICA/CCA RATIO:  0.8 ECA: 149/23 cm/sec LEFT  ICA: 106/22 cm/sec CCA: 116/16 cm/sec SYSTOLIC ICA/CCA RATIO:  0.9 ECA: 119/19 cm/sec RIGHT CAROTID ARTERY: Smooth plaque in the carotid bulb and ICA origin with only mild stenosis. Normal waveforms and color Doppler signal throughout. Moderate ICA tortuosity. RIGHT VERTEBRAL ARTERY:  Normal flow direction and waveform. LEFT CAROTID ARTERY: Calcified plaque in the mid common carotid without stenosis. Eccentric partially calcified plaque in the bulb and ICA origin with only mild stenosis. Moderate tortuosity of the distal ICA. Normal  waveforms and color Doppler signal throughout. LEFT VERTEBRAL ARTERY:  Normal flow direction and waveform. Upper extremity blood pressures: RIGHT: 151/81 LEFT: 139/78 IMPRESSION: 1. Bilateral carotid bifurcation plaque resulting in less than 50% diameter ICA stenosis. 2. Antegrade bilateral vertebral arterial flow. Electronically Signed   By: Corlis Leak M.D.   On: 02/01/2021 16:30    Assessment & Plan:   Problem List Items Addressed This Visit     Essential hypertension    Pt lost wt on diet      Relevant Medications   amLODipine (NORVASC) 5 MG tablet   irbesartan (AVAPRO) 300 MG tablet   rosuvastatin (CRESTOR) 5 MG tablet   Well adult exam - Primary    We discussed age appropriate health related issues, including available/recomended screening tests and vaccinations. We discussed a need for adhering to healthy diet and exercise. Labs were ordered to be later reviewed . All questions were answered.  Colon 2021 Dr Marina Goodell, due in a few years Prevnar 20 in 2025      Relevant Orders   TSH   Urinalysis   CBC with Differential/Platelet   Lipid panel   PSA   Comprehensive metabolic panel   Hemoglobin A1c   Low back pain    L sided SI joint      Dyslipidemia    Statins discussed - Crestor Mild  2020  lipids are up, however, it is the best we can do with his relative statin drug intolerance. We can refer you to Cardiology to discuss Repatha injections to lower  lipids      Relevant Medications   rosuvastatin (CRESTOR) 5 MG tablet   Other Relevant Orders   Lipid panel   Comprehensive metabolic panel   Hyperglycemia    Check A1c      Relevant Orders   Hemoglobin A1c   Other Visit Diagnoses     Bladder neck obstruction       Relevant Orders   PSA         Meds ordered this encounter  Medications   amLODipine (NORVASC) 5 MG tablet    Sig: Take 1 tablet (5 mg total) by mouth daily.    Dispense:  90 tablet    Refill:  3   famotidine (PEPCID) 40 MG tablet    Sig: Take 1 tablet (40 mg total) by mouth daily.    Dispense:  90 tablet    Refill:  3   fluticasone (FLONASE) 50 MCG/ACT nasal spray    Sig: Place 1-2 sprays into both nostrils daily as needed for rhinitis.    Dispense:  48 g    Refill:  3   ipratropium (ATROVENT) 0.06 % nasal spray    Sig: Place 2 sprays into the nose 3 (three) times daily.    Dispense:  15 mL    Refill:  2   irbesartan (AVAPRO) 300 MG tablet    Sig: Take 1 tablet (300 mg total) by mouth daily. Annual appt due in June must see provider for future refills    Dispense:  90 tablet    Refill:  3   levocetirizine (XYZAL ALLERGY 24HR) 5 MG tablet    Sig: Take 1 tablet (5 mg total) by mouth every evening.    Dispense:  90 tablet    Refill:  3   montelukast (SINGULAIR) 10 MG tablet    Sig: Take 1 tablet (10 mg total) by mouth daily. Annual appt due in June must see provider for future refills  Dispense:  90 tablet    Refill:  3   rosuvastatin (CRESTOR) 5 MG tablet    Sig: Take 1 tablet (5 mg total) by mouth daily.    Dispense:  90 tablet    Refill:  3   valACYclovir (VALTREX) 500 MG tablet    Sig: Take 1 tablet (500 mg total) by mouth 2 (two) times daily.    Dispense:  180 tablet    Refill:  3    This prescription was filled on 12/05/2020. Any refills authorized will be placed on file.   zolpidem (AMBIEN) 10 MG tablet    Sig: 1 po qhs prn    Dispense:  90 tablet    Refill:  1      Follow-up:  Return in about 6 months (around 07/24/2023) for a follow-up visit.  Sonda Primes, MD

## 2023-01-22 NOTE — Assessment & Plan Note (Signed)
Statins discussed - Crestor Mild  2020  lipids are up, however, it is the best we can do with his relative statin drug intolerance. We can refer you to Cardiology to discuss Repatha injections to lower lipids

## 2023-01-22 NOTE — Assessment & Plan Note (Signed)
Pt lost wt on diet 

## 2023-01-22 NOTE — Assessment & Plan Note (Addendum)
We discussed age appropriate health related issues, including available/recomended screening tests and vaccinations. We discussed a need for adhering to healthy diet and exercise. Labs were ordered to be later reviewed . All questions were answered.  Colon 2021 Dr Marina Goodell, due in a few years Prevnar 20 in 2025

## 2023-01-22 NOTE — Assessment & Plan Note (Signed)
L sided SI joint

## 2023-03-06 DIAGNOSIS — L308 Other specified dermatitis: Secondary | ICD-10-CM | POA: Diagnosis not present

## 2023-03-06 DIAGNOSIS — L578 Other skin changes due to chronic exposure to nonionizing radiation: Secondary | ICD-10-CM | POA: Diagnosis not present

## 2023-03-06 DIAGNOSIS — D224 Melanocytic nevi of scalp and neck: Secondary | ICD-10-CM | POA: Diagnosis not present

## 2023-03-06 DIAGNOSIS — L570 Actinic keratosis: Secondary | ICD-10-CM | POA: Diagnosis not present

## 2023-05-01 ENCOUNTER — Encounter: Payer: Self-pay | Admitting: Internal Medicine

## 2023-06-27 DIAGNOSIS — Z961 Presence of intraocular lens: Secondary | ICD-10-CM | POA: Diagnosis not present

## 2023-07-24 ENCOUNTER — Ambulatory Visit: Payer: Medicare Other | Admitting: Internal Medicine

## 2023-07-24 ENCOUNTER — Encounter: Payer: Self-pay | Admitting: Internal Medicine

## 2023-07-24 ENCOUNTER — Ambulatory Visit (INDEPENDENT_AMBULATORY_CARE_PROVIDER_SITE_OTHER): Payer: Medicare Other

## 2023-07-24 VITALS — BP 110/60 | HR 60 | Temp 98.2°F | Ht 68.0 in | Wt 170.0 lb

## 2023-07-24 DIAGNOSIS — E785 Hyperlipidemia, unspecified: Secondary | ICD-10-CM | POA: Diagnosis not present

## 2023-07-24 DIAGNOSIS — I2583 Coronary atherosclerosis due to lipid rich plaque: Secondary | ICD-10-CM

## 2023-07-24 DIAGNOSIS — F4321 Adjustment disorder with depressed mood: Secondary | ICD-10-CM | POA: Diagnosis not present

## 2023-07-24 DIAGNOSIS — R062 Wheezing: Secondary | ICD-10-CM | POA: Insufficient documentation

## 2023-07-24 DIAGNOSIS — I10 Essential (primary) hypertension: Secondary | ICD-10-CM | POA: Diagnosis not present

## 2023-07-24 DIAGNOSIS — Z87891 Personal history of nicotine dependence: Secondary | ICD-10-CM | POA: Diagnosis not present

## 2023-07-24 LAB — COMPREHENSIVE METABOLIC PANEL
ALT: 12 U/L (ref 0–53)
AST: 20 U/L (ref 0–37)
Albumin: 4.3 g/dL (ref 3.5–5.2)
Alkaline Phosphatase: 88 U/L (ref 39–117)
BUN: 14 mg/dL (ref 6–23)
CO2: 29 meq/L (ref 19–32)
Calcium: 9.6 mg/dL (ref 8.4–10.5)
Chloride: 105 meq/L (ref 96–112)
Creatinine, Ser: 0.98 mg/dL (ref 0.40–1.50)
GFR: 77.26 mL/min (ref >60.00)
Glucose, Bld: 89 mg/dL (ref 70–99)
Potassium: 4.3 meq/L (ref 3.5–5.1)
Sodium: 140 meq/L (ref 135–145)
Total Bilirubin: 1.1 mg/dL (ref 0.2–1.2)
Total Protein: 7.2 g/dL (ref 6.0–8.3)

## 2023-07-24 LAB — LIPID PANEL
Cholesterol: 179 mg/dL (ref 0–200)
HDL: 86.7 mg/dL (ref >39.00)
LDL Cholesterol: 82 mg/dL (ref 0–99)
NonHDL: 92.02
Total CHOL/HDL Ratio: 2
Triglycerides: 50 mg/dL (ref 0.0–149.0)
VLDL: 10 mg/dL (ref 0.0–40.0)

## 2023-07-24 LAB — TSH: TSH: 0.99 u[IU]/mL (ref 0.35–5.50)

## 2023-07-24 MED ORDER — ZOLPIDEM TARTRATE 10 MG PO TABS: ORAL_TABLET | ORAL | 1 refills | Status: AC

## 2023-07-24 MED ORDER — ROSUVASTATIN CALCIUM 5 MG PO TABS: 5.0000 mg | ORAL_TABLET | Freq: Every day | ORAL | 3 refills | Status: AC

## 2023-07-24 MED ORDER — VALACYCLOVIR HCL 500 MG PO TABS: 500.0000 mg | ORAL_TABLET | Freq: Two times a day (BID) | ORAL | 3 refills | Status: AC

## 2023-07-24 MED ORDER — IRBESARTAN 150 MG PO TABS: 150.0000 mg | ORAL_TABLET | Freq: Every day | ORAL | 3 refills | Status: AC

## 2023-07-24 MED ORDER — MONTELUKAST SODIUM 10 MG PO TABS: 10.0000 mg | ORAL_TABLET | Freq: Every day | ORAL | 3 refills | Status: AC

## 2023-07-24 MED ORDER — AMLODIPINE BESYLATE 5 MG PO TABS: 5.0000 mg | ORAL_TABLET | Freq: Every day | ORAL | 3 refills | Status: AC

## 2023-07-24 NOTE — Assessment & Plan Note (Signed)
?  mucus Will get a CXR

## 2023-07-24 NOTE — Assessment & Plan Note (Signed)
Raymond Snow died in Aug 03, 2022

## 2023-07-24 NOTE — Assessment & Plan Note (Signed)
Chronic  Lost wt on diet Playing golf 3-5 d/wk Cont on Norvasc, Irbesartan - dose reduced

## 2023-07-24 NOTE — Assessment & Plan Note (Signed)
Chronic  Lost wt on diet Playing golf 3-5 d/wk Cont on Crestor- dose reduced to qod

## 2023-07-24 NOTE — Progress Notes (Signed)
Subjective:  Patient ID: Raymond Snow, male    DOB: [birth year]  Age: 72 y.o. MRN: [id number]  CC: Medical Management of Chronic Issues (6 MNTH F/U)   HPI Raymond Snow presents for HTN, GERD, dyslipidemia Lost wt on diet Playing golf 3-5 d/wk  Outpatient Medications Prior to Visit  Medication Sig Dispense Refill   aspirin 81 MG chewable tablet Chew 81 mg by mouth daily.     chlorhexidine (PERIDEX) 0.12 % solution 2 (two) times daily.     famotidine (PEPCID) 40 MG tablet Take 1 tablet (40 mg total) by mouth daily. 90 tablet 3   fluticasone (FLONASE) 50 MCG/ACT nasal spray Place 1-2 sprays into both nostrils daily as needed for rhinitis. 48 g 3   ipratropium (ATROVENT) 0.06 % nasal spray Place 2 sprays into the nose 3 (three) times daily. 15 mL 2   levocetirizine (XYZAL ALLERGY 24HR) 5 MG tablet Take 1 tablet (5 mg total) by mouth every evening. 90 tablet 3   amLODipine (NORVASC) 5 MG tablet Take 1 tablet (5 mg total) by mouth daily. 90 tablet 3   irbesartan (AVAPRO) 300 MG tablet Take 1 tablet (300 mg total) by mouth daily. Annual appt due in June must see provider for future refills 90 tablet 3   montelukast (SINGULAIR) 10 MG tablet Take 1 tablet (10 mg total) by mouth daily. Annual appt due in June must see provider for future refills 90 tablet 3   rosuvastatin (CRESTOR) 5 MG tablet Take 1 tablet (5 mg total) by mouth daily. 90 tablet 3   valACYclovir (VALTREX) 500 MG tablet Take 1 tablet (500 mg total) by mouth 2 (two) times daily. 180 tablet 3   zolpidem (AMBIEN) 10 MG tablet 1 po qhs prn 90 tablet 1   No facility-administered medications prior to visit.    ROS: Review of Systems  Constitutional:  Negative for appetite change, fatigue and unexpected weight change.  HENT:  Positive for congestion, postnasal drip and rhinorrhea. Negative for nosebleeds, sneezing, sore throat and trouble swallowing.   Eyes:  Negative for itching and visual disturbance.  Respiratory:  Negative  for cough, choking, chest tightness, shortness of breath, wheezing and stridor.   Cardiovascular:  Negative for chest pain, palpitations and leg swelling.  Gastrointestinal:  Negative for abdominal distention, blood in stool, diarrhea and nausea.  Genitourinary:  Negative for frequency and hematuria.  Musculoskeletal:  Negative for back pain, gait problem, joint swelling and neck pain.  Skin:  Negative for rash.  Neurological:  Negative for dizziness, tremors, speech difficulty and weakness.  Hematological:  Does not bruise/bleed easily.  Psychiatric/Behavioral:  Negative for agitation, dysphoric mood and sleep disturbance. The patient is not nervous/anxious.     Objective:  BP 110/60 (BP Location: Left Arm, Patient Position: Sitting, Cuff Size: Normal)   Pulse 60   Temp 98.2 F (36.8 C) (Oral)   Ht 5\' 8"  (1.727 m)   Wt 170 lb (77.1 kg)   SpO2 97%   BMI 25.85 kg/m   BP Readings from Last 3 Encounters:  07/24/23 110/60  01/22/23 118/78  07/23/22 124/78    Wt Readings from Last 3 Encounters:  07/24/23 170 lb (77.1 kg)  01/22/23 189 lb (85.7 kg)  09/14/22 201 lb (91.2 kg)    Physical Exam Constitutional:      General: He is not in acute distress.    Appearance: Normal appearance. He is well-developed.     Comments: NAD  Eyes:  Conjunctiva/sclera: Conjunctivae normal.     Pupils: Pupils are equal, round, and reactive to light.  Neck:     Thyroid: No thyromegaly.     Vascular: No JVD.  Cardiovascular:     Rate and Rhythm: Normal rate and regular rhythm.     Heart sounds: Normal heart sounds. No murmur heard.    No friction rub. No gallop.  Pulmonary:     Effort: Pulmonary effort is normal. No respiratory distress.     Breath sounds: Wheezing present. No rales.  Chest:     Chest wall: No tenderness.  Abdominal:     General: Bowel sounds are normal. There is no distension.     Palpations: Abdomen is soft. There is no mass.     Tenderness: There is no abdominal  tenderness. There is no guarding or rebound.  Musculoskeletal:        General: No tenderness. Normal range of motion.     Cervical back: Normal range of motion.     Right lower leg: No edema.     Left lower leg: No edema.  Lymphadenopathy:     Cervical: No cervical adenopathy.  Skin:    General: Skin is warm and dry.     Findings: No rash.  Neurological:     Mental Status: He is alert and oriented to person, place, and time.     Cranial Nerves: No cranial nerve deficit.     Motor: No abnormal muscle tone.     Coordination: Coordination normal.     Gait: Gait normal.     Deep Tendon Reflexes: Reflexes are normal and symmetric.  Psychiatric:        Behavior: Behavior normal.        Thought Content: Thought content normal.        Judgment: Judgment normal.   B wheezing  Lab Results  Component Value Date   WBC 6.2 01/22/2023   HGB 14.8 01/22/2023   HCT 45.7 01/22/2023   PLT 202.0 01/22/2023   GLUCOSE 84 01/22/2023   CHOL 173 01/22/2023   TRIG 49.0 01/22/2023   HDL 90.70 01/22/2023   LDLDIRECT 169.7 06/01/2013   LDLCALC 72 01/22/2023   ALT 11 01/22/2023   AST 20 01/22/2023   NA 139 01/22/2023   K 5.0 01/22/2023   CL 103 01/22/2023   CREATININE 1.00 01/22/2023   BUN 13 01/22/2023   CO2 28 01/22/2023   TSH 0.86 01/22/2023   PSA 1.07 01/22/2023   INR 0.97 11/22/2012   HGBA1C 5.3 01/22/2023    US Carotid Bilateral  Result Date: 02/01/2021 CLINICAL DATA:  Bruit. Hypertension, hyperlipidemia, previous tobacco abuse. EXAM: BILATERAL CAROTID DUPLEX ULTRASOUND TECHNIQUE: Wallace Cullens scale imaging, color Doppler and duplex ultrasound were performed of bilateral carotid and vertebral arteries in the neck. COMPARISON:  None. FINDINGS: Criteria: Quantification of carotid stenosis is based on velocity parameters that correlate the residual internal carotid diameter with NASCET-based stenosis levels, using the diameter of the distal internal carotid lumen as the denominator for stenosis  measurement. The following velocity measurements were obtained: RIGHT ICA: 93/17 cm/sec CCA: 124/19 cm/sec SYSTOLIC ICA/CCA RATIO:  0.8 ECA: 149/23 cm/sec LEFT ICA: 106/22 cm/sec CCA: 116/16 cm/sec SYSTOLIC ICA/CCA RATIO:  0.9 ECA: 119/19 cm/sec RIGHT CAROTID ARTERY: Smooth plaque in the carotid bulb and ICA origin with only mild stenosis. Normal waveforms and color Doppler signal throughout. Moderate ICA tortuosity. RIGHT VERTEBRAL ARTERY:  Normal flow direction and waveform. LEFT CAROTID ARTERY: Calcified plaque in the mid common carotid  without stenosis. Eccentric partially calcified plaque in the bulb and ICA origin with only mild stenosis. Moderate tortuosity of the distal ICA. Normal waveforms and color Doppler signal throughout. LEFT VERTEBRAL ARTERY:  Normal flow direction and waveform. Upper extremity blood pressures: RIGHT: 151/81 LEFT: 139/78 IMPRESSION: 1. Bilateral carotid bifurcation plaque resulting in less than 50% diameter ICA stenosis. 2. Antegrade bilateral vertebral arterial flow. Electronically Signed   By: Corlis Leak M.D.   On: 02/01/2021 16:30    Assessment & Plan:   Problem List Items Addressed This Visit     Essential hypertension    Chronic  Lost wt on diet Playing golf 3-5 d/wk Cont on Norvasc, Irbesartan - dose reduced      Relevant Medications   irbesartan (AVAPRO) 150 MG tablet   amLODipine (NORVASC) 5 MG tablet   rosuvastatin (CRESTOR) 5 MG tablet   Other Relevant Orders   TSH   Comprehensive metabolic panel   Lipid panel   Dyslipidemia - Primary    Chronic  Lost wt on diet Playing golf 3-5 d/wk Cont on Crestor- dose reduced to qod      Relevant Medications   rosuvastatin (CRESTOR) 5 MG tablet   Other Relevant Orders   TSH   Comprehensive metabolic panel   Lipid panel   Coronary atherosclerosis    Chronic  Lost wt on diet Playing golf 3-5 d/wk Cont on Crestor- dose reduced to qod      Relevant Medications   irbesartan (AVAPRO) 150 MG tablet    amLODipine (NORVASC) 5 MG tablet   rosuvastatin (CRESTOR) 5 MG tablet   Grief    Beverly died in 07-30-2022      Wheezing    ?mucus Will get a CXR      Relevant Orders   DG Chest 2 View      Meds ordered this encounter  Medications   irbesartan (AVAPRO) 150 MG tablet    Sig: Take 1 tablet (150 mg total) by mouth daily.    Dispense:  90 tablet    Refill:  3   amLODipine (NORVASC) 5 MG tablet    Sig: Take 1 tablet (5 mg total) by mouth daily.    Dispense:  90 tablet    Refill:  3   montelukast (SINGULAIR) 10 MG tablet    Sig: Take 1 tablet (10 mg total) by mouth daily. Annual appt due in June must see provider for future refills    Dispense:  90 tablet    Refill:  3   rosuvastatin (CRESTOR) 5 MG tablet    Sig: Take 1 tablet (5 mg total) by mouth daily.    Dispense:  90 tablet    Refill:  3   valACYclovir (VALTREX) 500 MG tablet    Sig: Take 1 tablet (500 mg total) by mouth 2 (two) times daily.    Dispense:  180 tablet    Refill:  3    This prescription was filled on 12/05/2020. Any refills authorized will be placed on file.   zolpidem (AMBIEN) 10 MG tablet    Sig: 1 po qhs prn    Dispense:  90 tablet    Refill:  1      Follow-up: No follow-ups on file.  Sonda Primes, MD

## 2023-09-05 ENCOUNTER — Ambulatory Visit: Payer: Medicare Other

## 2023-09-05 VITALS — BP 110/60 | HR 65 | Ht 66.0 in | Wt 168.0 lb

## 2023-09-05 DIAGNOSIS — Z0000 Encounter for general adult medical examination without abnormal findings: Secondary | ICD-10-CM

## 2023-09-05 NOTE — Progress Notes (Signed)
Subjective:   Raymond Snow is a 73 y.o. male who presents for Medicare Annual/Subsequent preventive examination.  Visit Complete: In person  Patient Medicare AWV questionnaire was completed by the patient on 09/01/23; I have confirmed that all information answered by patient is correct and no changes since this date.  Cardiac Risk Factors include: advanced age (>49men, >28 women);hypertension;dyslipidemia;Other (see comment), Risk factor comments: Atherosclerosis of aorta ,Coronary atherosclerosis     Objective:    Today's Vitals   09/05/23 0931  BP: 110/60  Pulse: 65  SpO2: 100%  Weight: 168 lb (76.2 kg)  Height: 5\' 6"  (1.676 m)   Body mass index is 27.12 kg/m.     09/05/2023    8:57 AM 09/14/2022    8:49 AM 09/04/2021    8:18 AM 07/18/2020   11:23 AM 11/25/2012    1:53 PM  Advanced Directives  Does Patient Have a Medical Advance Directive? Yes Yes Yes Yes Patient has advance directive, copy not in chart  Type of Advance Directive Healthcare Power of [place];Living will Healthcare Power of [place];Living will Healthcare Power of [place];Living will  Healthcare Power of [place];Living will  Does patient want to make changes to medical advance directive?    No - Patient declined   Copy of Healthcare Power of Attorney in Chart? No - copy requested No - copy requested No - copy requested      Current Medications (verified) Outpatient Encounter Medications as of 09/05/2023  Medication Sig   amLODipine (NORVASC) 5 MG tablet Take 1 tablet (5 mg total) by mouth daily.   aspirin 81 MG chewable tablet Chew 81 mg by mouth daily.   chlorhexidine (PERIDEX) 0.12 % solution 2 (two) times daily.   famotidine (PEPCID) 40 MG tablet Take 1 tablet (40 mg total) by mouth daily.   fluticasone (FLONASE) 50 MCG/ACT nasal spray Place 1-2 sprays into both nostrils daily as needed for rhinitis.   ipratropium (ATROVENT) 0.06 % nasal spray Place 2 sprays into the nose 3 (three) times daily.    irbesartan (AVAPRO) 150 MG tablet Take 1 tablet (150 mg total) by mouth daily.   levocetirizine (XYZAL ALLERGY 24HR) 5 MG tablet Take 1 tablet (5 mg total) by mouth every evening.   montelukast (SINGULAIR) 10 MG tablet Take 1 tablet (10 mg total) by mouth daily. Annual appt due in June must see provider for future refills   rosuvastatin (CRESTOR) 5 MG tablet Take 1 tablet (5 mg total) by mouth daily.   valACYclovir (VALTREX) 500 MG tablet Take 1 tablet (500 mg total) by mouth 2 (two) times daily.   zolpidem (AMBIEN) 10 MG tablet 1 po qhs prn   No facility-administered encounter medications on file as of 09/05/2023.    Allergies (verified) Lipitor [atorvastatin]   History: Past Medical History:  Diagnosis Date   Allergy    Diverticulosis    colon 2009 Dr Tedra [place]   Genital herpes    on the leg   H/O hiatal hernia    HTN (hypertension)    Hyperlipidemia    Rosacea    Tinnitus    Past Surgical History:  Procedure Laterality Date   COLONOSCOPY  04/18/2010   at [facility]=normal exam    OPEN REDUCTION INTERNAL FIXATION (ORIF) DISTAL RADIAL FRACTURE Left 12/01/2012   Procedure: OPEN REDUCTION INTERNAL FIXATION (ORIF) LEFT DISTAL RADIAL FRACTURE;  Surgeon: Jodi Marble, MD;  Location: [facility];  Service: Orthopedics;  Laterality: Left;   TONSILLECTOMY  Family History  Problem Relation Age of Onset   Diverticulitis Father    Diabetes Father    Diabetes Mother    Diverticulitis Mother    Colon cancer Neg Hx    Colon polyps Neg Hx    Esophageal cancer Neg Hx    Rectal cancer Neg Hx    Stomach cancer Neg Hx    Social History   Socioeconomic History   Marital status: Widowed    Spouse name: Not on file   Number of children: 3   Years of education: Not on file   Highest education level: Bachelor's degree (e.g., BA, AB, BS)  Occupational History   Occupation: RETIRED/Director of [employer]  Tobacco Use   Smoking status: Former    Current packs/day:  0.00    Types: Cigarettes    Quit date: 08/20/1986    Years since quitting: 37.0   Smokeless tobacco: Never  Vaping Use   Vaping status: Never Used  Substance and Sexual Activity   Alcohol use: Yes    Alcohol/week: 14.0 standard drinks of alcohol    Types: 14 Standard drinks or equivalent per week   Drug use: No   Sexual activity: Yes  Other Topics Concern   Not on file  Social History Narrative   Regular exercise - NO      Lives alone- 2 dogs  2025-2 stepchildren and 1 son   Social Drivers of Corporate investment banker Strain: Low Risk  (09/05/2023)   Overall Financial Resource Strain (CARDIA)    Difficulty of Paying Living Expenses: Not hard at all  Food Insecurity: No Food Insecurity (09/05/2023)   Hunger Vital Sign    Worried About Running Out of Food in the Last Year: Never true    Ran Out of Food in the Last Year: Never true  Transportation Needs: No Transportation Needs (09/05/2023)   PRAPARE - Administrator, Civil Service (Medical): No    Lack of Transportation (Non-Medical): No  Physical Activity: Sufficiently Active (09/05/2023)   Exercise Vital Sign    Days of Exercise per Week: 5 days    Minutes of Exercise per Session: 60 min  Stress: No Stress Concern Present (09/05/2023)   [employer] of Occupational Health - Occupational Stress Questionnaire    Feeling of Stress : Not at all  Social Connections: Socially Isolated (09/05/2023)   Social Connection and Isolation Panel [NHANES]    Frequency of Communication with Friends and Family: Once a week    Frequency of Social Gatherings with Friends and Family: More than three times a week    Attends Religious Services: Never    Database administrator or Organizations: No    Attends Banker Meetings: Never    Marital Status: Widowed    Tobacco Counseling Counseling given: Not Answered   Clinical Intake:  Pre-visit preparation completed: Yes  Pain : No/denies pain        How  often do you need to have someone help you when you read instructions, pamphlets, or other written materials from your doctor or pharmacy?: 1 - Never  Interpreter Needed?: No  Information entered by :: Evo Aderman, RMA   Activities of Daily Living    09/01/2023    9:42 AM 09/14/2022    8:57 AM  In your present state of health, do you have any difficulty performing the following activities:  Hearing? 0 0  Vision? 0 0  Difficulty concentrating or making decisions? 0 0  Walking or climbing stairs? 0 0  Dressing or bathing? 0 0  Doing errands, shopping? 0 0  Preparing Food and eating ? N N  Using the Toilet? N N  In the past six months, have you accidently leaked urine? N N  Do you have problems with loss of bowel control? N N  Managing your Medications? N N  Managing your Finances? N N  Housekeeping or managing your Housekeeping? N N    Patient Care Team: Plotnikov, Georgina Quint, MD as PCP - Anders Simmonds, MD as Consulting Physician (Ophthalmology)  Indicate any recent Medical Services you may have received from other than [facility] providers in the past year (date may be approximate).     Assessment:   This is a routine wellness examination for [facility].  Hearing/Vision screen Hearing Screening - Comments:: Denies hearing difficulties   Vision Screening - Comments:: Has implants   Goals Addressed             This Visit's Progress    My goal for 2024 is to lose a few pounds and stay under 200 pounds.   On track    Patient has lost the weight and is keeping it off.-2025      Depression Screen    09/05/2023    9:01 AM 07/24/2023    9:11 AM 09/14/2022    8:51 AM 07/23/2022    9:12 AM 09/04/2021    8:19 AM 09/04/2021    8:16 AM 07/20/2021    1:26 PM  PHQ 2/9 Scores  PHQ - 2 Score 0 0 0 0 0 0 0  PHQ- 9 Score 0      2    Fall Risk    09/01/2023    9:42 AM 07/24/2023    9:11 AM 09/14/2022    8:50 AM 09/13/2022    9:59 AM 07/23/2022    9:11 AM  Fall Risk   Falls  in the past year? 0 0 0 0 0  Number falls in past yr:  0 0 0 0  Injury with Fall? 0 0 0 0 0  Risk for fall due to :  No Fall Risks No Fall Risks  No Fall Risks  Follow up  Falls evaluation completed Falls prevention discussed  Falls evaluation completed    MEDICARE RISK AT HOME: Medicare Risk at Home Any stairs in or around the home?: (Patient-Rptd) Yes If so, are there any without handrails?: (Patient-Rptd) No Home free of loose throw rugs in walkways, pet beds, electrical cords, etc?: (Patient-Rptd) Yes Adequate lighting in your home to reduce risk of falls?: (Patient-Rptd) Yes Life alert?: (Patient-Rptd) No Use of a cane, walker or w/c?: (Patient-Rptd) No Grab bars in the bathroom?: (Patient-Rptd) Yes Shower chair or bench in shower?: (Patient-Rptd) No Elevated toilet seat or a handicapped toilet?: (Patient-Rptd) Yes  TIMED UP AND GO:  Was the test performed?  Yes  Length of time to ambulate 10 feet: 10 sec Gait steady and fast without use of assistive device    Cognitive Function:        09/05/2023    8:48 AM 09/14/2022    8:59 AM 07/18/2020   11:25 AM  6CIT Screen  What Year? 0 points 0 points 0 points  What month? 0 points 0 points 0 points  What time? 0 points 0 points 0 points  Count back from 20 0 points 0 points 0 points  Months in reverse 0 points 0 points 0 points  Repeat phrase  0 points 0 points 0 points  Total Score 0 points 0 points 0 points    Immunizations Immunization History  Administered Date(s) Administered   Fluad Quad(high Dose 65+) 04/18/2019, 05/06/2020, 05/16/2022   Influenza Whole 04/18/2010, 06/04/2012   Influenza,inj,Quad PF,6+ Mos 06/08/2013, 06/14/2014, 06/16/2015, 05/25/2016, 05/24/2017, 05/08/2021   Influenza-Unspecified 05/01/2023   Moderna Covid-19 Vaccine Bivalent Booster 28yrs & up 06/13/2021   Moderna SARS-COV2 Booster Vaccination 12/01/2020   PFIZER(Purple Top)SARS-COV-2 Vaccination 09/26/2019, 10/21/2019, 05/14/2020    Pneumococcal Conjugate-13 12/17/2016   Pneumococcal Polysaccharide-23 06/14/2014, 07/20/2019   Td 08/21/2007   Tdap 12/19/2017   Zoster Recombinant(Shingrix) 02/27/2018, 05/15/2018   Zoster, Live 06/18/2012    TDAP status: Up to date  Flu Vaccine status: Up to date  Pneumococcal vaccine status: Up to date  Covid-19 vaccine status: Declined, Education has been provided regarding the importance of this vaccine but patient still declined. Advised may receive this vaccine at local pharmacy or Health Dept.or vaccine clinic. Aware to provide a copy of the vaccination record if obtained from local pharmacy or Health Dept. Verbalized acceptance and understanding.  Qualifies for Shingles Vaccine? Yes   Zostavax completed Yes   Shingrix Completed?: Yes  Screening Tests Health Maintenance  Topic Date Due   COVID-19 Vaccine (5 - 2024-25 season) 09/21/2023 (Originally 04/21/2023)   Medicare Annual Wellness (AWV)  09/04/2024   Colonoscopy  05/03/2027   DTaP/Tdap/Td (3 - Td or Tdap) 12/20/2027   Pneumonia Vaccine 37+ Years old  Completed   INFLUENZA VACCINE  Completed   Hepatitis C Screening  Completed   Zoster Vaccines- Shingrix  Completed   HPV VACCINES  Aged Out    Health Maintenance  There are no preventive care reminders to display for this patient.   Colorectal cancer screening: Type of screening: Colonoscopy. Completed 05/02/2020. Repeat every 7 years  Lung Cancer Screening: (Low Dose CT Chest recommended if Age 6-80 years, 20 pack-year currently smoking OR have quit w/in 15years.) does not qualify.   Lung Cancer Screening Referral: N/A  Additional Screening:  Hepatitis C Screening: does qualify; Completed 12/15/2015  Vision Screening: Recommended annual ophthalmology exams for early detection of glaucoma and other disorders of the eye. Is the patient up to date with their annual eye exam?  Yes  Who is the provider or what is the name of the office in which the patient  attends annual eye exams? Dr. Randon Goldsmith If pt is not established with a provider, would they like to be referred to a provider to establish care? No .   Dental Screening: Recommended annual dental exams for proper oral hygiene   Community Resource Referral / Chronic Care Management: CRR required this visit?  No   CCM required this visit?  No     Plan:     I have personally reviewed and noted the following in the patient's chart:   Medical and social history Use of alcohol, tobacco or illicit drugs  Current medications and supplements including opioid prescriptions. Patient is not currently taking opioid prescriptions. Functional ability and status Nutritional status Physical activity Advanced directives List of other physicians Hospitalizations, surgeries, and ER visits in previous 12 months Vitals Screenings to include cognitive, depression, and falls Referrals and appointments  In addition, I have reviewed and discussed with patient certain preventive protocols, quality metrics, and best practice recommendations. A written personalized care plan for preventive services as well as general preventive health recommendations were provided to patient.     Thayer Inabinet L Caleyah Jr, CMA   09/05/2023  After Visit Summary: (MyChart) Due to this being a telephonic visit, the after visit summary with patients personalized plan was offered to patient via MyChart   Nurse Notes: Patient is up to date on all health maintenance and has no concerns to address today.

## 2023-09-05 NOTE — Patient Instructions (Signed)
Mr. Herzfeld , Thank you for taking time to come for your Medicare Wellness Visit. I appreciate your ongoing commitment to your health goals. Please review the following plan we discussed and let me know if I can assist you in the future.   Referrals/Orders/Follow-Ups/Clinician Recommendations: It was nice to meet you today.  Keep up the good work.  This is a list of the screening recommended for you and due dates:  Health Maintenance  Topic Date Due   COVID-19 Vaccine (5 - 2024-25 season) 04/21/2023   Medicare Annual Wellness Visit  09/04/2024   Colon Cancer Screening  05/03/2027   DTaP/Tdap/Td vaccine (3 - Td or Tdap) 12/20/2027   Pneumonia Vaccine  Completed   Flu Shot  Completed   Hepatitis C Screening  Completed   Zoster (Shingles) Vaccine  Completed   HPV Vaccine  Aged Out    Advanced directives: (Copy Requested) Please bring a copy of your health care power of attorney and living will to the office to be added to your chart at your convenience.  Next Medicare Annual Wellness Visit scheduled for next year: Yes

## 2023-09-09 DIAGNOSIS — L821 Other seborrheic keratosis: Secondary | ICD-10-CM | POA: Diagnosis not present

## 2023-09-09 DIAGNOSIS — L814 Other melanin hyperpigmentation: Secondary | ICD-10-CM | POA: Diagnosis not present

## 2023-09-09 DIAGNOSIS — Z08 Encounter for follow-up examination after completed treatment for malignant neoplasm: Secondary | ICD-10-CM | POA: Diagnosis not present

## 2023-09-09 DIAGNOSIS — Z85828 Personal history of other malignant neoplasm of skin: Secondary | ICD-10-CM | POA: Diagnosis not present

## 2023-09-09 DIAGNOSIS — L570 Actinic keratosis: Secondary | ICD-10-CM | POA: Diagnosis not present

## 2023-09-09 DIAGNOSIS — D225 Melanocytic nevi of trunk: Secondary | ICD-10-CM | POA: Diagnosis not present

## 2023-11-07 DIAGNOSIS — L570 Actinic keratosis: Secondary | ICD-10-CM | POA: Diagnosis not present

## 2023-11-11 ENCOUNTER — Telehealth: Payer: Self-pay | Admitting: Internal Medicine

## 2023-11-11 NOTE — Telephone Encounter (Signed)
 Copied from CRM [id number]. Topic: Clinical - Medication Refill >> Nov 11, 2023  8:49 AM Isabell A wrote: Most Recent Primary Care Visit:  Provider: Wyvonne Lenz  Department: [facility]  Visit Type: MEDICARE AWV, SEQUENTIAL  Date: 09/05/2023  Medication: [employer] - Pt does not remember brand name.   Has the patient contacted their pharmacy? No (Agent: If no, request that the patient contact the pharmacy for the refill. If patient does not wish to contact the pharmacy document the reason why and proceed with request.) (Agent: If yes, when and what did the pharmacy advise?)  Is this the correct pharmacy for this prescription? Yes If no, delete pharmacy and type the correct one.  This is the patient's preferred pharmacy:  [facility] [place], [place] - [address] [place] [address] Phone: [phone] Fax: [phone]   Has the prescription been filled recently? No  Is the patient out of the medication? Yes  Has the patient been seen for an appointment in the last year OR does the patient have an upcoming appointment? Yes  Can we respond through MyChart? No  Agent: Please be advised that Rx refills may take up to 3 business days. We ask that you follow-up with your pharmacy.

## 2023-11-13 NOTE — Telephone Encounter (Signed)
 I do not see any "[employer]" in pt medication list that you prescribe, please advise

## 2023-11-14 MED ORDER — NEOMYCIN-POLYMYXIN-HC 3.5-10000-1 OT SOLN: 3.0000 [drp] | Freq: Three times a day (TID) | OTIC | 3 refills | Status: AC

## 2023-11-14 NOTE — Addendum Note (Signed)
 Addended by: Tresa Garter on: 11/14/2023 07:38 AM   Modules accepted: Orders

## 2023-11-14 NOTE — Telephone Encounter (Signed)
 Okay.  Done.  Thanks

## 2023-11-18 ENCOUNTER — Encounter: Payer: Self-pay | Admitting: Internal Medicine

## 2023-11-18 ENCOUNTER — Ambulatory Visit (INDEPENDENT_AMBULATORY_CARE_PROVIDER_SITE_OTHER): Admitting: Internal Medicine

## 2023-11-18 VITALS — BP 116/68 | HR 62 | Temp 97.9°F | Ht 66.0 in | Wt 160.8 lb

## 2023-11-18 DIAGNOSIS — Z0000 Encounter for general adult medical examination without abnormal findings: Secondary | ICD-10-CM

## 2023-11-18 DIAGNOSIS — I10 Essential (primary) hypertension: Secondary | ICD-10-CM

## 2023-11-18 DIAGNOSIS — I2583 Coronary atherosclerosis due to lipid rich plaque: Secondary | ICD-10-CM | POA: Diagnosis not present

## 2023-11-18 DIAGNOSIS — L853 Xerosis cutis: Secondary | ICD-10-CM | POA: Diagnosis not present

## 2023-11-18 DIAGNOSIS — R739 Hyperglycemia, unspecified: Secondary | ICD-10-CM

## 2023-11-18 DIAGNOSIS — E785 Hyperlipidemia, unspecified: Secondary | ICD-10-CM | POA: Diagnosis not present

## 2023-11-18 DIAGNOSIS — F5101 Primary insomnia: Secondary | ICD-10-CM | POA: Diagnosis not present

## 2023-11-18 DIAGNOSIS — N320 Bladder-neck obstruction: Secondary | ICD-10-CM

## 2023-11-18 LAB — COMPREHENSIVE METABOLIC PANEL WITH GFR
ALT: 10 U/L (ref 0–53)
AST: 18 U/L (ref 0–37)
Albumin: 4.4 g/dL (ref 3.5–5.2)
Alkaline Phosphatase: 90 U/L (ref 39–117)
BUN: 15 mg/dL (ref 6–23)
CO2: 29 meq/L (ref 19–32)
Calcium: 9.9 mg/dL (ref 8.4–10.5)
Chloride: 104 meq/L (ref 96–112)
Creatinine, Ser: 0.94 mg/dL (ref 0.40–1.50)
GFR: 81.03 mL/min (ref >60.00)
Glucose, Bld: 81 mg/dL (ref 70–99)
Potassium: 4.4 meq/L (ref 3.5–5.1)
Sodium: 141 meq/L (ref 135–145)
Total Bilirubin: 0.9 mg/dL (ref 0.2–1.2)
Total Protein: 7.2 g/dL (ref 6.0–8.3)

## 2023-11-18 LAB — CBC WITH DIFFERENTIAL/PLATELET
Basophils Absolute: 0.1 10*3/uL (ref 0.0–0.1)
Basophils Relative: 1 % (ref 0.0–3.0)
Eosinophils Absolute: 0.1 10*3/uL (ref 0.0–0.7)
Eosinophils Relative: 1.8 % (ref 0.0–5.0)
HCT: 45.7 % (ref 39.0–52.0)
Hemoglobin: 15.1 g/dL (ref 13.0–17.0)
Lymphocytes Relative: 24.1 % (ref 12.0–46.0)
Lymphs Abs: 1.6 10*3/uL (ref 0.7–4.0)
MCHC: 33.2 g/dL (ref 30.0–36.0)
MCV: 97.5 fl (ref 78.0–100.0)
Monocytes Absolute: 0.5 10*3/uL (ref 0.1–1.0)
Monocytes Relative: 7.3 % (ref 3.0–12.0)
Neutro Abs: 4.5 10*3/uL (ref 1.4–7.7)
Neutrophils Relative %: 65.8 % (ref 43.0–77.0)
Platelets: 198 10*3/uL (ref 150.0–400.0)
RBC: 4.68 Mil/uL (ref 4.22–5.81)
RDW: 14.4 % (ref 11.5–15.5)
WBC: 6.8 10*3/uL (ref 4.0–10.5)

## 2023-11-18 LAB — LIPID PANEL
Cholesterol: 203 mg/dL — ABNORMAL HIGH (ref 0–200)
HDL: 102.3 mg/dL (ref >39.00)
LDL Cholesterol: 89 mg/dL (ref 0–99)
NonHDL: 100.66
Total CHOL/HDL Ratio: 2
Triglycerides: 59 mg/dL (ref 0.0–149.0)
VLDL: 11.8 mg/dL (ref 0.0–40.0)

## 2023-11-18 LAB — HEMOGLOBIN A1C: Hgb A1c MFr Bld: 5.1 % (ref 4.6–6.5)

## 2023-11-18 LAB — PSA: PSA: 0.9 ng/mL (ref 0.10–4.00)

## 2023-11-18 LAB — VITAMIN B12: Vitamin B-12: 443 pg/mL (ref 211–911)

## 2023-11-18 LAB — TSH: TSH: 1.05 u[IU]/mL (ref 0.35–5.50)

## 2023-11-18 MED ORDER — CLOBETASOL PROPIONATE 0.05 % EX CREA: 1.0000 | TOPICAL_CREAM | Freq: Two times a day (BID) | CUTANEOUS | 1 refills | Status: AC

## 2023-11-18 NOTE — Assessment & Plan Note (Signed)
 Chronic  Lost wt on diet Playing golf 3-5 d/wk Cont on Crestor- dose reduced to qod

## 2023-11-18 NOTE — Assessment & Plan Note (Signed)
 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

## 2023-11-18 NOTE — Progress Notes (Signed)
 Subjective:  Patient ID: Raymond Snow, male    DOB: 1951-08-08  Age: 73 y.o. MRN: [id number]  CC: Medical Management of Chronic Issues (6 Month Follow Up. Patient notes of intermittent itching present on the upper portion of the back for several years. Patient has since had a full body exam from dermatologist which was unremarkable. Currently treating with lotions which seems to be helping)   HPI Raymond Snow presents for HTN, dyslipidemia, CAD.  Raymond Snow is moving to [facility]) C/o rash on back   Outpatient Medications Prior to Visit  Medication Sig Dispense Refill   amLODipine (NORVASC) 5 MG tablet Take 1 tablet (5 mg total) by mouth daily. 90 tablet 3   aspirin 81 MG chewable tablet Chew 81 mg by mouth daily.     chlorhexidine (PERIDEX) 0.12 % solution 2 (two) times daily.     famotidine (PEPCID) 40 MG tablet Take 1 tablet (40 mg total) by mouth daily. 90 tablet 3   fluticasone (FLONASE) 50 MCG/ACT nasal spray Place 1-2 sprays into both nostrils daily as needed for rhinitis. 48 g 3   ipratropium (ATROVENT) 0.06 % nasal spray Place 2 sprays into the nose 3 (three) times daily. 15 mL 2   irbesartan (AVAPRO) 150 MG tablet Take 1 tablet (150 mg total) by mouth daily. 90 tablet 3   levocetirizine (XYZAL ALLERGY 24HR) 5 MG tablet Take 1 tablet (5 mg total) by mouth every evening. 90 tablet 3   montelukast (SINGULAIR) 10 MG tablet Take 1 tablet (10 mg total) by mouth daily. Annual appt due in June must see provider for future refills 90 tablet 3   neomycin-polymyxin-hydrocortisone (CORTISPORIN) OTIC solution Place 3 drops into both ears 3 (three) times daily. 10 mL 3   rosuvastatin (CRESTOR) 5 MG tablet Take 1 tablet (5 mg total) by mouth daily. 90 tablet 3   valACYclovir (VALTREX) 500 MG tablet Take 1 tablet (500 mg total) by mouth 2 (two) times daily. 180 tablet 3   zolpidem (AMBIEN) 10 MG tablet 1 po qhs prn 90 tablet 1   No facility-administered medications prior to visit.     ROS: Review of Systems  Constitutional:  Negative for appetite change, fatigue and unexpected weight change.  HENT:  Negative for congestion, nosebleeds, sneezing, sore throat and trouble swallowing.   Eyes:  Negative for itching and visual disturbance.  Respiratory:  Negative for cough.   Cardiovascular:  Negative for chest pain, palpitations and leg swelling.  Gastrointestinal:  Negative for abdominal distention, blood in stool, diarrhea and nausea.  Genitourinary:  Negative for frequency and hematuria.  Musculoskeletal:  Negative for back pain, gait problem, joint swelling and neck pain.  Skin:  Negative for rash.  Neurological:  Negative for dizziness, tremors, speech difficulty and weakness.  Psychiatric/Behavioral:  Negative for agitation, dysphoric mood and sleep disturbance. The patient is not nervous/anxious.     Objective:  BP 116/68   Pulse 62   Temp 97.9 F (36.6 C)   Ht 5\' 6"  (1.676 m)   Wt 160 lb 12.8 oz (72.9 kg)   SpO2 99%   BMI 25.95 kg/m   BP Readings from Last 3 Encounters:  11/18/23 116/68  09/05/23 110/60  07/24/23 110/60    Wt Readings from Last 3 Encounters:  11/18/23 160 lb 12.8 oz (72.9 kg)  09/05/23 168 lb (76.2 kg)  07/24/23 170 lb (77.1 kg)    Physical Exam Constitutional:      General: He is not in  acute distress.    Appearance: He is well-developed.     Comments: NAD  Eyes:     Conjunctiva/sclera: Conjunctivae normal.     Pupils: Pupils are equal, round, and reactive to light.  Neck:     Thyroid: No thyromegaly.     Vascular: No JVD.  Cardiovascular:     Rate and Rhythm: Normal rate and regular rhythm.     Heart sounds: Normal heart sounds. No murmur heard.    No friction rub. No gallop.  Pulmonary:     Effort: Pulmonary effort is normal. No respiratory distress.     Breath sounds: Normal breath sounds. No wheezing or rales.  Chest:     Chest wall: No tenderness.  Abdominal:     General: Bowel sounds are normal. There is  no distension.     Palpations: Abdomen is soft. There is no mass.     Tenderness: There is no abdominal tenderness. There is no guarding or rebound.  Musculoskeletal:        General: No tenderness. Normal range of motion.     Cervical back: Normal range of motion.  Lymphadenopathy:     Cervical: No cervical adenopathy.  Skin:    General: Skin is warm and dry.     Findings: No rash.  Neurological:     Mental Status: He is alert and oriented to person, place, and time.     Cranial Nerves: No cranial nerve deficit.     Motor: No abnormal muscle tone.     Coordination: Coordination normal.     Gait: Gait normal.     Deep Tendon Reflexes: Reflexes are normal and symmetric.  Psychiatric:        Behavior: Behavior normal.        Thought Content: Thought content normal.        Judgment: Judgment normal.   Patch of the dry skin and erythema on the central back  Lab Results  Component Value Date   WBC 6.2 01/22/2023   HGB 14.8 01/22/2023   HCT 45.7 01/22/2023   PLT 202.0 01/22/2023   GLUCOSE 89 07/24/2023   CHOL 179 07/24/2023   TRIG 50.0 07/24/2023   HDL 86.70 07/24/2023   LDLDIRECT 169.7 06/01/2013   LDLCALC 82 07/24/2023   ALT 12 07/24/2023   AST 20 07/24/2023   NA 140 07/24/2023   K 4.3 07/24/2023   CL 105 07/24/2023   CREATININE 0.98 07/24/2023   BUN 14 07/24/2023   CO2 29 07/24/2023   TSH 0.99 07/24/2023   PSA 1.07 01/22/2023   INR 0.97 11/22/2012   HGBA1C 5.3 01/22/2023    US Carotid Bilateral Result Date: 02/01/2021 CLINICAL DATA:  Bruit. Hypertension, hyperlipidemia, previous tobacco abuse. EXAM: BILATERAL CAROTID DUPLEX ULTRASOUND TECHNIQUE: Wallace Cullens scale imaging, color Doppler and duplex ultrasound were performed of bilateral carotid and vertebral arteries in the neck. COMPARISON:  None. FINDINGS: Criteria: Quantification of carotid stenosis is based on velocity parameters that correlate the residual internal carotid diameter with NASCET-based stenosis levels, using  the diameter of the distal internal carotid lumen as the denominator for stenosis measurement. The following velocity measurements were obtained: RIGHT ICA: 93/17 cm/sec CCA: 124/19 cm/sec SYSTOLIC ICA/CCA RATIO:  0.8 ECA: 149/23 cm/sec LEFT ICA: 106/22 cm/sec CCA: 116/16 cm/sec SYSTOLIC ICA/CCA RATIO:  0.9 ECA: 119/19 cm/sec RIGHT CAROTID ARTERY: Smooth plaque in the carotid bulb and ICA origin with only mild stenosis. Normal waveforms and color Doppler signal throughout. Moderate ICA tortuosity. RIGHT VERTEBRAL ARTERY:  Normal  flow direction and waveform. LEFT CAROTID ARTERY: Calcified plaque in the mid common carotid without stenosis. Eccentric partially calcified plaque in the bulb and ICA origin with only mild stenosis. Moderate tortuosity of the distal ICA. Normal waveforms and color Doppler signal throughout. LEFT VERTEBRAL ARTERY:  Normal flow direction and waveform. Upper extremity blood pressures: RIGHT: 151/81 LEFT: 139/78 IMPRESSION: 1. Bilateral carotid bifurcation plaque resulting in less than 50% diameter ICA stenosis. 2. Antegrade bilateral vertebral arterial flow. Electronically Signed   By: Corlis Leak M.D.   On: 02/01/2021 16:30    Assessment & Plan:   Problem List Items Addressed This Visit     Essential hypertension   Chronic  Lost wt on diet Playing golf 3-5 d/wk Cont on Norvasc, Irbesartan - dose reduced  Raymond Snow is moving to [facility] [employer])      Well adult exam   Relevant Orders   Comprehensive metabolic panel with GFR   CBC with Differential/Platelet   TSH   Hemoglobin A1c   Vitamin B12   PSA   Lipid panel   Dyslipidemia   Relevant Orders   Comprehensive metabolic panel with GFR   CBC with Differential/Platelet   TSH   Hemoglobin A1c   Vitamin B12   PSA   Lipid panel   Insomnia   On Zolpidem  Potential benefits of a long term benzodiazepines  use as well as potential risks  and complications were explained to the patient and were aknowledged.      Coronary  atherosclerosis   Chronic  Lost wt on diet Playing golf 3-5 d/wk Cont on Crestor- dose reduced to qod      Hyperglycemia   Relevant Orders   Comprehensive metabolic panel with GFR   CBC with Differential/Platelet   TSH   Hemoglobin A1c   Vitamin B12   PSA   Lipid panel   Bladder neck obstruction   Relevant Orders   Comprehensive metabolic panel with GFR   CBC with Differential/Platelet   TSH   Hemoglobin A1c   Vitamin B12   PSA   Lipid panel   Dry skin dermatitis - Primary   Patch on the central back Clobetasol cream bid Derm OV if not better         Meds ordered this encounter  Medications   clobetasol cream (TEMOVATE) 0.05 %    Sig: Apply 1 Application topically 2 (two) times daily.    Dispense:  60 g    Refill:  1      Follow-up: No follow-ups on file.  Sonda Primes, MD

## 2023-11-18 NOTE — Assessment & Plan Note (Signed)
 Patch on the central back Clobetasol cream bid Derm OV if not better

## 2023-11-18 NOTE — Assessment & Plan Note (Signed)
On Zolpidem  Potential benefits of a long term benzodiazepines  use as well as potential risks  and complications were explained to the patient and were aknowledged. 

## 2023-11-19 ENCOUNTER — Encounter: Payer: Self-pay | Admitting: Internal Medicine

## 2023-12-19 ENCOUNTER — Ambulatory Visit: Payer: Medicare Other | Admitting: Internal Medicine

## 2024-01-20 DIAGNOSIS — K219 Gastro-esophageal reflux disease without esophagitis: Secondary | ICD-10-CM | POA: Diagnosis not present

## 2024-01-20 DIAGNOSIS — Z7189 Other specified counseling: Secondary | ICD-10-CM | POA: Diagnosis not present

## 2024-01-20 DIAGNOSIS — Z0000 Encounter for general adult medical examination without abnormal findings: Secondary | ICD-10-CM | POA: Diagnosis not present

## 2024-01-20 DIAGNOSIS — Z1389 Encounter for screening for other disorder: Secondary | ICD-10-CM | POA: Diagnosis not present

## 2024-01-20 DIAGNOSIS — Z79899 Other long term (current) drug therapy: Secondary | ICD-10-CM | POA: Diagnosis not present

## 2024-01-20 DIAGNOSIS — E782 Mixed hyperlipidemia: Secondary | ICD-10-CM | POA: Diagnosis not present

## 2024-01-20 DIAGNOSIS — I10 Essential (primary) hypertension: Secondary | ICD-10-CM | POA: Diagnosis not present

## 2024-01-22 ENCOUNTER — Ambulatory Visit: Payer: Medicare Other | Admitting: Internal Medicine

## 2024-01-30 DIAGNOSIS — Z79899 Other long term (current) drug therapy: Secondary | ICD-10-CM | POA: Diagnosis not present

## 2024-01-30 DIAGNOSIS — E782 Mixed hyperlipidemia: Secondary | ICD-10-CM | POA: Diagnosis not present

## 2024-02-28 DIAGNOSIS — L570 Actinic keratosis: Secondary | ICD-10-CM | POA: Diagnosis not present

## 2024-02-28 DIAGNOSIS — L814 Other melanin hyperpigmentation: Secondary | ICD-10-CM | POA: Diagnosis not present

## 2024-02-28 DIAGNOSIS — Z85828 Personal history of other malignant neoplasm of skin: Secondary | ICD-10-CM | POA: Diagnosis not present

## 2024-02-28 DIAGNOSIS — L578 Other skin changes due to chronic exposure to nonionizing radiation: Secondary | ICD-10-CM | POA: Diagnosis not present

## 2024-03-10 ENCOUNTER — Telehealth: Payer: Self-pay

## 2024-03-10 NOTE — Telephone Encounter (Signed)
 Copied from CRM [id number]. Topic: Appointments - Appointment Cancel/Reschedule >> Mar 10, 2024 11:45 AM Raymond Snow wrote: Patient/patient representative is calling to cancel or reschedule an appointment. Refer to attachments for appointment information.  Patient called to cancel his AWV for January 2026, he no longer livers in [place] and has moved to [place] .

## 2024-07-07 ENCOUNTER — Telehealth: Payer: Self-pay | Admitting: Internal Medicine

## 2024-07-07 NOTE — Telephone Encounter (Signed)
 Contacted Raymond Snow [facility] to schedule their annual wellness visit. Patient declined to schedule AWV at this time.Patient is now living in [place] . Transferred care.  [facility] Care Guide [facility] AWV TEAM Direct Dial: [phone]

## 2024-07-30 NOTE — Telephone Encounter (Signed)
 PCP removed.

## 2024-09-07 ENCOUNTER — Ambulatory Visit: Payer: Medicare Other
